# Patient Record
Sex: Female | Born: 1955 | Race: Black or African American | Hispanic: No | Marital: Married | Smoking: Former smoker
Health system: Southern US, Community
[De-identification: ages and names within clinical notes are randomized; demographics above are authoritative.]

## PROBLEM LIST (undated history)

## (undated) DIAGNOSIS — E669 Obesity, unspecified: Secondary | ICD-10-CM

## (undated) DIAGNOSIS — R079 Chest pain, unspecified: Secondary | ICD-10-CM

## (undated) DIAGNOSIS — I890 Lymphedema, not elsewhere classified: Secondary | ICD-10-CM

## (undated) DIAGNOSIS — M1990 Unspecified osteoarthritis, unspecified site: Secondary | ICD-10-CM

## (undated) DIAGNOSIS — M25569 Pain in unspecified knee: Secondary | ICD-10-CM

## (undated) DIAGNOSIS — E559 Vitamin D deficiency, unspecified: Secondary | ICD-10-CM

## (undated) DIAGNOSIS — R7303 Prediabetes: Secondary | ICD-10-CM

## (undated) DIAGNOSIS — J45909 Unspecified asthma, uncomplicated: Secondary | ICD-10-CM

## (undated) DIAGNOSIS — K219 Gastro-esophageal reflux disease without esophagitis: Secondary | ICD-10-CM

## (undated) DIAGNOSIS — R002 Palpitations: Secondary | ICD-10-CM

## (undated) HISTORY — DX: Prediabetes: R73.03

## (undated) HISTORY — DX: Lymphedema, not elsewhere classified: I89.0

## (undated) HISTORY — DX: Vitamin D deficiency, unspecified: E55.9

## (undated) HISTORY — DX: Unspecified osteoarthritis, unspecified site: M19.90

---

## 2007-07-23 ENCOUNTER — Encounter: Payer: Self-pay | Admitting: Internal Medicine

## 2007-11-08 ENCOUNTER — Encounter: Payer: Self-pay | Admitting: Internal Medicine

## 2008-06-02 ENCOUNTER — Ambulatory Visit: Payer: Self-pay | Admitting: Internal Medicine

## 2008-06-02 DIAGNOSIS — J45909 Unspecified asthma, uncomplicated: Secondary | ICD-10-CM | POA: Insufficient documentation

## 2008-06-02 DIAGNOSIS — M545 Low back pain: Secondary | ICD-10-CM | POA: Insufficient documentation

## 2008-06-02 DIAGNOSIS — Z860100 Personal history of colon polyps, unspecified: Secondary | ICD-10-CM | POA: Insufficient documentation

## 2008-06-02 DIAGNOSIS — J309 Allergic rhinitis, unspecified: Secondary | ICD-10-CM | POA: Insufficient documentation

## 2008-06-02 DIAGNOSIS — J4520 Mild intermittent asthma, uncomplicated: Secondary | ICD-10-CM | POA: Insufficient documentation

## 2008-06-02 LAB — CONVERTED CEMR LAB
Albumin: 4.1 g/dL (ref 3.5–5.2)
Alkaline Phosphatase: 78 units/L (ref 39–117)
Basophils Relative: 0 % (ref 0–1)
Bilirubin, Direct: 0.1 mg/dL (ref 0.0–0.3)
Eosinophils Absolute: 0.2 10*3/uL (ref 0.0–0.7)
Glucose, Bld: 86 mg/dL (ref 70–99)
HCT: 38.9 % (ref 36.0–46.0)
Hemoglobin: 12.6 g/dL (ref 12.0–15.0)
Indirect Bilirubin: 0.1 mg/dL (ref 0.0–0.9)
Ketones, urine, test strip: NEGATIVE
MCV: 77.5 fL — ABNORMAL LOW (ref 78.0–100.0)
Monocytes Absolute: 0.6 10*3/uL (ref 0.1–1.0)
Monocytes Relative: 7 % (ref 3–12)
Neutro Abs: 4.3 10*3/uL (ref 1.7–7.7)
Neutrophils Relative %: 53 % (ref 43–77)
Potassium: 4.1 meq/L (ref 3.5–5.3)
RBC: 5.02 M/uL (ref 3.87–5.11)
RDW: 15.2 % (ref 11.5–15.5)
Sodium: 142 meq/L (ref 135–145)
Urobilinogen, UA: 0.2
WBC Urine, dipstick: NEGATIVE
pH: 6.5

## 2008-06-06 ENCOUNTER — Telehealth: Payer: Self-pay | Admitting: Internal Medicine

## 2008-06-14 ENCOUNTER — Telehealth: Payer: Self-pay | Admitting: Internal Medicine

## 2008-12-04 ENCOUNTER — Encounter: Admission: RE | Admit: 2008-12-04 | Discharge: 2008-12-04 | Payer: Self-pay | Admitting: Family Medicine

## 2010-06-24 ENCOUNTER — Other Ambulatory Visit: Payer: Self-pay | Admitting: Internal Medicine

## 2010-09-09 DIAGNOSIS — E559 Vitamin D deficiency, unspecified: Secondary | ICD-10-CM | POA: Insufficient documentation

## 2011-02-11 HISTORY — PX: COLONOSCOPY WITH ESOPHAGOGASTRODUODENOSCOPY (EGD): SHX5779

## 2011-04-28 DIAGNOSIS — IMO0002 Reserved for concepts with insufficient information to code with codable children: Secondary | ICD-10-CM | POA: Insufficient documentation

## 2012-06-03 ENCOUNTER — Emergency Department (HOSPITAL_COMMUNITY): Payer: No Typology Code available for payment source

## 2012-06-03 ENCOUNTER — Emergency Department (HOSPITAL_COMMUNITY)
Admission: EM | Admit: 2012-06-03 | Discharge: 2012-06-04 | Disposition: A | Discharge status: Home / Self Care | Payer: No Typology Code available for payment source | Attending: Emergency Medicine | Admitting: Emergency Medicine

## 2012-06-03 ENCOUNTER — Encounter (HOSPITAL_COMMUNITY): Payer: Self-pay | Admitting: *Deleted

## 2012-06-03 DIAGNOSIS — K5900 Constipation, unspecified: Secondary | ICD-10-CM | POA: Insufficient documentation

## 2012-06-03 DIAGNOSIS — R1032 Left lower quadrant pain: Secondary | ICD-10-CM | POA: Insufficient documentation

## 2012-06-03 DIAGNOSIS — R109 Unspecified abdominal pain: Secondary | ICD-10-CM

## 2012-06-03 DIAGNOSIS — N390 Urinary tract infection, site not specified: Secondary | ICD-10-CM | POA: Insufficient documentation

## 2012-06-03 DIAGNOSIS — K644 Residual hemorrhoidal skin tags: Secondary | ICD-10-CM | POA: Insufficient documentation

## 2012-06-03 DIAGNOSIS — M25559 Pain in unspecified hip: Secondary | ICD-10-CM | POA: Insufficient documentation

## 2012-06-03 DIAGNOSIS — R110 Nausea: Secondary | ICD-10-CM | POA: Insufficient documentation

## 2012-06-03 LAB — COMPREHENSIVE METABOLIC PANEL
ALT: 14 U/L (ref 0–35)
AST: 18 U/L (ref 0–37)
Albumin: 3.8 g/dL (ref 3.5–5.2)
Alkaline Phosphatase: 69 U/L (ref 39–117)
CO2: 29 mEq/L (ref 19–32)
Creatinine, Ser: 1.07 mg/dL (ref 0.50–1.10)
GFR calc Af Amer: 65 mL/min — ABNORMAL LOW (ref >90)
GFR calc non Af Amer: 56 mL/min — ABNORMAL LOW (ref >90)
Glucose, Bld: 84 mg/dL (ref 70–99)
Potassium: 3.9 mEq/L (ref 3.5–5.1)
Total Bilirubin: 0.2 mg/dL — ABNORMAL LOW (ref 0.3–1.2)

## 2012-06-03 LAB — URINALYSIS, ROUTINE W REFLEX MICROSCOPIC
Bilirubin Urine: NEGATIVE
Glucose, UA: NEGATIVE mg/dL
Hgb urine dipstick: NEGATIVE
Ketones, ur: NEGATIVE mg/dL
Leukocytes, UA: NEGATIVE
Protein, ur: NEGATIVE mg/dL
Urobilinogen, UA: 0.2 mg/dL (ref 0.0–1.0)

## 2012-06-03 LAB — CBC WITH DIFFERENTIAL/PLATELET
Eosinophils Relative: 2 % (ref 0–5)
MCV: 78.1 fL (ref 78.0–100.0)
Platelets: 245 10*3/uL (ref 150–400)
RBC: 5.16 MIL/uL — ABNORMAL HIGH (ref 3.87–5.11)
RDW: 14.8 % (ref 11.5–15.5)

## 2012-06-03 MED ORDER — IOHEXOL 300 MG/ML  SOLN
50.0000 mL | Freq: Once | INTRAMUSCULAR | Status: AC | PRN
  Administered 2012-06-03: 50 mL via ORAL

## 2012-06-03 MED ORDER — IOHEXOL 300 MG/ML  SOLN: 100.0000 mL | Freq: Once | INTRAMUSCULAR | Status: AC | PRN

## 2012-06-03 NOTE — ED Notes (Signed)
Pt given oral contrast by CT.

## 2012-06-03 NOTE — ED Notes (Signed)
The pt has had lt flank and abd pain for 2 weeks.  She saw her doctor on Saturday and was diagnosed with constipation .  bm each day.  She is also c/o nausea

## 2012-06-03 NOTE — ED Notes (Signed)
Pt c/o pain in lower right back radiating into right groin area.  St's walking or standing causes pain to increase.  Pt also c/o constipation st's she took a laxative with some results.

## 2012-06-03 NOTE — ED Notes (Signed)
Pt finished 1st cup of contrast.  CT made aware

## 2012-06-03 NOTE — ED Provider Notes (Signed)
History     CSN: [id number]  Arrival date & time 06/03/12  2003   First MD Initiated Contact with Patient 06/03/12 2101      Chief Complaint  Patient presents with  . Abdominal Pain    (Consider location/radiation/quality/duration/timing/severity/associated sxs/prior treatment) HPI Comments: Patient with past history of C-section presents with complaint of left hip, left lower quadrant abdominal pelvic pain for the past 2 weeks. Pain radiates into her left thigh. It is worse with movement, palpation, and walking. It is described as sharp. It is constant. Patient saw her primary care physician several days ago and was diagnosed with constipation and started on laxatives. She was also treated for urinary tract infection. Patient is been having several small bowel movements a day. She's had nausea but no vomiting. No fever, weight loss, urinary symptoms. She's been taking ibuprofen for pain without much relief. Patient states that the pain is gradually worsening with time. She denies claudication symptoms. No history of A. fib, peripheral vascular disease. No skin changes. Onset acute. Course is gradually worsening. Nothing makes symptoms better.  The history is provided by the patient.    History reviewed. No pertinent past medical history.  History reviewed. No pertinent past surgical history.  No family history on file.  History  Substance Use Topics  . Smoking status: Never Smoker   . Smokeless tobacco: Not on file  . Alcohol Use: No    OB History   Grav Para Term Preterm Abortions TAB SAB Ect Mult Living                  Review of Systems  Constitutional: Negative for fever.  HENT: Negative for sore throat and rhinorrhea.   Eyes: Negative for redness.  Respiratory: Negative for cough.   Cardiovascular: Negative for chest pain.  Gastrointestinal: Positive for nausea and abdominal pain. Negative for vomiting, diarrhea, constipation and blood in stool.  Genitourinary:  Negative for dysuria.  Musculoskeletal: Negative for myalgias.  Skin: Negative for rash.  Neurological: Negative for headaches.    Allergies  Review of patient's allergies indicates no known allergies.  Home Medications   Current Outpatient Rx  Name  Route  Sig  Dispense  Refill  . ibuprofen (ADVIL,MOTRIN) 200 MG tablet   Oral   Take 200 mg by mouth every 6 (six) hours as needed for pain.         . Vitamin D, Ergocalciferol, (DRISDOL) 50000 UNITS CAPS   Oral   Take 50,000 Units by mouth every 7 (seven) days. Take on saturdays           BP 159/78  Pulse 80  Temp(Src) 99.1 F (37.3 C) (Oral)  Resp 18  SpO2 99%  Physical Exam  Nursing note and vitals reviewed. Constitutional: She appears well-developed and well-nourished.  HENT:  Head: Normocephalic and atraumatic.  Eyes: Conjunctivae are normal. Right eye exhibits no discharge. Left eye exhibits no discharge.  Neck: Normal range of motion. Neck supple.  Cardiovascular: Normal rate, regular rhythm and normal heart sounds.   Pulses:      Dorsalis pedis pulses are 2+ on the right side, and 2+ on the left side.       Posterior tibial pulses are 2+ on the right side, and 2+ on the left side.  Pulmonary/Chest: Effort normal and breath sounds normal.  Abdominal: Soft. Bowel sounds are normal. She exhibits no distension. There is tenderness in the suprapubic area and left lower quadrant. There is no rigidity, no  rebound, no guarding, no CVA tenderness, no tenderness at McBurney's point and negative Murphy's sign.    Genitourinary: Rectal exam shows external hemorrhoid. Rectal exam shows no fissure, no mass, no tenderness and anal tone normal. Guaiac negative stool.  No fecal impaction noted.  Musculoskeletal:  Patient with normal range of motion of left hip actively without pain  Neurological: She is alert.  Skin: Skin is warm and dry.  Psychiatric: She has a normal mood and affect.    ED Course  Procedures (including  critical care time)  Labs Reviewed  CBC WITH DIFFERENTIAL - Abnormal; Notable for the following:    WBC 11.0 (*)    RBC 5.16 (*)    All other components within normal limits  COMPREHENSIVE METABOLIC PANEL - Abnormal; Notable for the following:    Total Bilirubin 0.2 (*)    GFR calc non Af Amer 56 (*)    GFR calc Af Amer 65 (*)    All other components within normal limits  URINALYSIS, ROUTINE W REFLEX MICROSCOPIC  OCCULT BLOOD, POC DEVICE   Ct Abdomen Pelvis W Contrast  06/04/2012  *RADIOLOGY REPORT*  Clinical Data: Left lower quadrant pelvic pain.  CT ABDOMEN AND PELVIS WITH CONTRAST  Technique:  Multidetector CT imaging of the abdomen and pelvis was performed following the standard protocol during bolus administration of intravenous contrast.  Contrast: OMNIPAQUE IOHEXOL 300 MG/ML  SOLN  Comparison: None  Findings: Lung bases:  Normal  Abdomen/pelvis:  Possible mild hepatic steatosis. Prominence of the right lobe of the liver, with a shape suggestive of a Reidel's lobe.  Normal spleen, stomach, pancreas, gallbladder, biliary tract, adrenal glands, kidneys.  Multiple phleboliths in the pelvis, without hydroureter.  No retroperitoneal or retrocrural adenopathy.  Fluid filled colon, suggesting a diarrheal state. Normal terminal ileum.  The appendix may be diminutive on image 50/series 3.  No right lower quadrant inflammation is seen.  Normal small bowel without abdominal ascites.    No pelvic adenopathy.    Normal urinary bladder and uterus.  No adnexal mass.  No significant free fluid.     Bones/Musculoskeletal:   lumbar spondylosis.  .  IMPRESSION: 1. No acute process or explanation for left lower quadrant pain.  2.  Fluid filled colon, suggesting a diarrheal state.   Original Report Authenticated By: Jeronimo Greaves, M.D.      1. Abdominal pain     10:08 PM Patient seen and examined. Work-up initiated. Medications ordered. Rectal exam performed with nurse chaperone Selena Batten). Discussed labs and  exam. Discussed CT vs f/u with PCP. Patient would like to have CT performed.   Vital signs reviewed and are as follows: Filed Vitals:   06/03/12 2015  BP: 159/78  Pulse: 80  Temp: 99.1 F (37.3 C)  Resp: 18   CT was reviewed by myself. PT informed of results. Re-exam unchanged. Tenderness but no rebound or guarding. Pt requests tylenol for pain. No vomiting.   Discussed need to follow-up with PCP for further evaluation.   The patient was urged to return to the Emergency Department immediately with worsening of current symptoms, worsening abdominal pain, persistent vomiting, blood noted in stools, fever, or any other concerns. The patient verbalized understanding.   BP 117/65  Pulse 74  Temp(Src) 99.1 F (37.3 C) (Oral)  Resp 18  SpO2 96%   MDM  Patient with LLQ abd/pelvic pain. There is also component of thigh pain -- however this seems to be referred. Patient can range hip without difficulty.  She has strong arterial pulses in feet and no claudication symptoms. Also considered radicular pain.  Vitals are stable, no fever.  No signs of dehydration, tolerating PO's.  Lungs are clear.  CT does not show specific cause to explain pain. No concern for appendicitis, cholecystitis, pancreatitis, ruptured viscus, UTI, kidney stone, or any other abdominal etiology.  Supportive therapy indicated with return if symptoms worsen.  Patient counseled.         Renne Crigler, PA-C 06/05/12 1314

## 2012-06-04 MED ORDER — IBUPROFEN 800 MG PO TABS
800.0000 mg | ORAL_TABLET | Freq: Once | ORAL | Status: DC
  Filled 2012-06-04: qty 1

## 2012-06-04 MED ORDER — IOHEXOL 300 MG/ML  SOLN
100.0000 mL | Freq: Once | INTRAMUSCULAR | Status: AC | PRN
  Administered 2012-06-04: 100 mL via INTRAVENOUS

## 2012-06-04 MED ORDER — ACETAMINOPHEN 325 MG PO TABS
650.0000 mg | ORAL_TABLET | Freq: Once | ORAL | Status: AC
  Administered 2012-06-04: 650 mg via ORAL
  Filled 2012-06-04: qty 2

## 2012-06-04 MED ORDER — OXYCODONE-ACETAMINOPHEN 5-325 MG PO TABS
2.0000 | ORAL_TABLET | Freq: Once | ORAL | Status: DC
  Filled 2012-06-04: qty 2

## 2012-06-04 NOTE — ED Notes (Signed)
ED PA Josh in with pt

## 2012-06-04 NOTE — ED Notes (Signed)
Pt st's she is now in too much pain, requesting pain medication.  PA Josh notified.

## 2012-06-07 NOTE — ED Provider Notes (Signed)
Medical screening examination/treatment/procedure(s) were performed by non-physician practitioner and as supervising physician I was immediately available for consultation/collaboration.   Dione Booze, MD 06/07/12 [phone]

## 2012-12-07 ENCOUNTER — Ambulatory Visit: Payer: No Typology Code available for payment source | Admitting: Internal Medicine

## 2012-12-23 ENCOUNTER — Emergency Department (HOSPITAL_COMMUNITY): Payer: No Typology Code available for payment source

## 2012-12-23 ENCOUNTER — Observation Stay (HOSPITAL_COMMUNITY)
Admission: EM | Admit: 2012-12-23 | Discharge: 2012-12-25 | Disposition: A | Discharge status: Home / Self Care | Payer: No Typology Code available for payment source | Attending: Internal Medicine | Admitting: Internal Medicine

## 2012-12-23 DIAGNOSIS — E669 Obesity, unspecified: Secondary | ICD-10-CM | POA: Insufficient documentation

## 2012-12-23 DIAGNOSIS — R0789 Other chest pain: Principal | ICD-10-CM | POA: Diagnosis present

## 2012-12-23 DIAGNOSIS — Z8249 Family history of ischemic heart disease and other diseases of the circulatory system: Secondary | ICD-10-CM | POA: Insufficient documentation

## 2012-12-23 DIAGNOSIS — M545 Low back pain, unspecified: Secondary | ICD-10-CM | POA: Insufficient documentation

## 2012-12-23 DIAGNOSIS — M25569 Pain in unspecified knee: Secondary | ICD-10-CM | POA: Insufficient documentation

## 2012-12-23 DIAGNOSIS — F17200 Nicotine dependence, unspecified, uncomplicated: Secondary | ICD-10-CM | POA: Insufficient documentation

## 2012-12-23 DIAGNOSIS — R0602 Shortness of breath: Secondary | ICD-10-CM | POA: Insufficient documentation

## 2012-12-23 DIAGNOSIS — F419 Anxiety disorder, unspecified: Secondary | ICD-10-CM | POA: Diagnosis present

## 2012-12-23 DIAGNOSIS — F411 Generalized anxiety disorder: Secondary | ICD-10-CM | POA: Insufficient documentation

## 2012-12-23 DIAGNOSIS — E663 Overweight: Secondary | ICD-10-CM | POA: Insufficient documentation

## 2012-12-23 DIAGNOSIS — R079 Chest pain, unspecified: Secondary | ICD-10-CM

## 2012-12-23 HISTORY — DX: Palpitations: R00.2

## 2012-12-23 HISTORY — DX: Pain in unspecified knee: M25.569

## 2012-12-23 HISTORY — DX: Chest pain, unspecified: R07.9

## 2012-12-23 HISTORY — DX: Obesity, unspecified: E66.9

## 2012-12-23 LAB — BASIC METABOLIC PANEL
BUN: 14 mg/dL (ref 6–23)
CO2: 27 mEq/L (ref 19–32)
Calcium: 8.9 mg/dL (ref 8.4–10.5)
Chloride: 104 mEq/L (ref 96–112)
Creatinine, Ser: 0.92 mg/dL (ref 0.50–1.10)
GFR calc Af Amer: 77 mL/min — ABNORMAL LOW (ref >90)
GFR calc non Af Amer: 67 mL/min — ABNORMAL LOW (ref >90)
Potassium: 4 mEq/L (ref 3.5–5.1)

## 2012-12-23 LAB — POCT I-STAT TROPONIN I: Troponin i, poc: 0 ng/mL (ref 0.00–0.08)

## 2012-12-23 LAB — CBC
HCT: 37.8 % (ref 36.0–46.0)
Hemoglobin: 12.6 g/dL (ref 12.0–15.0)
MCH: 26.4 pg (ref 26.0–34.0)
MCV: 79.2 fL (ref 78.0–100.0)
RBC: 4.77 MIL/uL (ref 3.87–5.11)
RDW: 14.6 % (ref 11.5–15.5)
WBC: 10.7 10*3/uL — ABNORMAL HIGH (ref 4.0–10.5)

## 2012-12-23 MED ORDER — ACETAMINOPHEN 325 MG PO TABS: 650.0000 mg | ORAL_TABLET | Freq: Once | ORAL | Status: DC

## 2012-12-23 MED ORDER — NITROGLYCERIN 0.4 MG SL SUBL
0.4000 mg | SUBLINGUAL_TABLET | SUBLINGUAL | Status: DC | PRN
  Administered 2012-12-23: 0.4 mg via SUBLINGUAL
  Filled 2012-12-23: qty 25

## 2012-12-23 MED ORDER — ASPIRIN 81 MG PO CHEW
324.0000 mg | CHEWABLE_TABLET | Freq: Once | ORAL | Status: AC
  Administered 2012-12-23: 324 mg via ORAL
  Filled 2012-12-23: qty 4

## 2012-12-23 NOTE — ED Provider Notes (Signed)
CSN: [id number]     Arrival date & time 12/23/12  2052 History   First MD Initiated Contact with Patient 12/23/12 2055     Chief Complaint  Patient presents with  . Chest Pain  . Shortness of Breath   (Consider location/radiation/quality/duration/timing/severity/associated sxs/prior Treatment) HPI Renee Kerr is a 57 y.o. female who presents emergency department complaining of chest pain. Patient states that she was at church when suddenly she began feeling chest tightness and shortness of breath. States symptoms improved once she got to emergency department. States she still having some tightness. She denies any pain. She states that the shortness of breath improved as well. She denies any history of the same. She states that 4 days ago she had an episode of chest palpitations that lasted approximately 20 minutes but resolved. She denies any feeling of heart racing or palpitations today. Patient denies any lower leg swelling. She denies any recent travel or surgeries. She did not take any medications prior to coming in. She denies any prior cardiac or medical problems. She does not take any medications. Patient does have history of cardiac disease in both parents.  No past medical history on file. No past surgical history on file. No family history on file. History  Substance Use Topics  . Smoking status: Never Smoker   . Smokeless tobacco: Not on file  . Alcohol Use: No   OB History   Grav Para Term Preterm Abortions TAB SAB Ect Mult Living                 Review of Systems  Constitutional: Negative for fever and chills.  Respiratory: Positive for chest tightness and shortness of breath. Negative for cough and wheezing.   Cardiovascular: Positive for chest pain. Negative for palpitations and leg swelling.  Gastrointestinal: Negative for nausea, vomiting, abdominal pain and diarrhea.  Genitourinary: Negative for dysuria, flank pain, vaginal bleeding, vaginal discharge, vaginal  pain and pelvic pain.  Musculoskeletal: Negative for arthralgias, myalgias, neck pain and neck stiffness.  Skin: Negative for rash.  Neurological: Negative for dizziness, weakness and headaches.  All other systems reviewed and are negative.    Allergies  Review of patient's allergies indicates no known allergies.  Home Medications   Current Outpatient Rx  Name  Route  Sig  Dispense  Refill  . ibuprofen (ADVIL,MOTRIN) 200 MG tablet   Oral   Take 200 mg by mouth every 6 (six) hours as needed for pain.         . Vitamin D, Ergocalciferol, (DRISDOL) 50000 UNITS CAPS   Oral   Take 50,000 Units by mouth every 7 (seven) days. Take on saturdays          BP 148/74  Pulse 94  Temp(Src) 98.7 F (37.1 C) (Oral)  Resp 17  Ht 4\' 11"  (1.499 m)  Wt 204 lb (92.534 kg)  BMI 41.18 kg/m2  SpO2 97% Physical Exam  Nursing note and vitals reviewed. Constitutional: She is oriented to person, place, and time. She appears well-developed and well-nourished. No distress.  HENT:  Head: Normocephalic.  Eyes: Conjunctivae are normal.  Neck: Neck supple.  Cardiovascular: Normal rate, regular rhythm and normal heart sounds.   Pulmonary/Chest: Effort normal and breath sounds normal. No respiratory distress. She has no wheezes. She has no rales.  Abdominal: Soft. Bowel sounds are normal. She exhibits no distension. There is no tenderness. There is no rebound.  Musculoskeletal: She exhibits no edema.  Neurological: She is alert and oriented to  person, place, and time.  Skin: Skin is warm and dry.  Psychiatric: She has a normal mood and affect. Her behavior is normal.    ED Course  Procedures (including critical care time) Labs Review Labs Reviewed  CBC - Abnormal; Notable for the following:    WBC 10.7 (*)    All other components within normal limits  BASIC METABOLIC PANEL - Abnormal; Notable for the following:    Glucose, Bld 101 (*)    GFR calc non Af Amer 67 (*)    GFR calc Af Amer 77  (*)    All other components within normal limits  POCT I-STAT TROPONIN I   Imaging Review Dg Chest Port 1 View  12/23/2012   CLINICAL DATA:  Chest pain and shortness of Breath  EXAM: PORTABLE CHEST - 1 VIEW  COMPARISON:  None.  FINDINGS: The heart size and mediastinal contours are within normal limits. Both lungs are clear. The bony thorax is demineralized but grossly intact.  IMPRESSION: No active disease.   Electronically Signed   By: Amie Portland M.D.   On: 12/23/2012 21:23    EKG Interpretation   None       MDM   1. Chest pain   2. Anxiety   3. Lumbago     Patient here with new onset of chest pain. She is otherwise healthy. He does have family history of cardiac disease. Her pain did improve after one nitroglycerin and aspirin. Her initial troponin and EKG unremarkable. Chest x-ray is negative. Patient will need admission for rule out. VS unremarkable except for slightly elevated BP.    Discussed with [facility] will admit.  Filed Vitals:   12/23/12 2058 12/23/12 2219 12/23/12 2224  BP: 148/74 134/61 137/70  Pulse: 94 74 78  Temp: 98.7 F (37.1 C)    TempSrc: Oral    Resp: 17 18 15   Height: 4\' 11"  (1.499 m)    Weight: 204 lb (92.534 kg)    SpO2: 97% 98% 93%       Lottie Mussel, PA-C 12/23/12 2316

## 2012-12-23 NOTE — ED Notes (Signed)
Pt reports she has been feeling "flurries" in her chest for the past 3 days, reports centralized chest tightness with ShOB began at 2040.  Pt denies hx of any of these feelings before.

## 2012-12-23 NOTE — ED Provider Notes (Signed)
Medical screening examination/treatment/procedure(s) were conducted as a shared visit with non-physician practitioner(s) and myself.  I personally evaluated the patient during the encounter.  EKG Interpretation   None       Date: 12/23/2012  Rate: 86  Rhythm: normal sinus rhythm  QRS Axis: normal  Intervals: normal  ST/T Wave abnormalities: normal  Conduction Disutrbances:none  Narrative Interpretation: multiple PVCs  Old EKG Reviewed: none available    Patient here for chest pain. L sided chest tightness, non-radiating, no prior hx of same. AFVSS here. No SOB, nausea, vomiting. No alleviating/exacerbating. Exam benign. Admitted for ACS r/o.  Dagmar Hait, MD 12/23/12 [phone]

## 2012-12-23 NOTE — ED Provider Notes (Signed)
Date: 12/23/2012  Rate: 86  Rhythm: normal sinus rhythm  QRS Axis: normal  Intervals: normal  ST/T Wave abnormalities: normal  Conduction Disutrbances:none  Narrative Interpretation:   Old EKG Reviewed: none available    Dagmar Hait, MD 12/23/12 2118

## 2012-12-24 ENCOUNTER — Encounter (HOSPITAL_COMMUNITY): Payer: Self-pay | Admitting: Physician Assistant

## 2012-12-24 DIAGNOSIS — I517 Cardiomegaly: Secondary | ICD-10-CM

## 2012-12-24 DIAGNOSIS — R0789 Other chest pain: Secondary | ICD-10-CM

## 2012-12-24 LAB — HEMOGLOBIN A1C
Hgb A1c MFr Bld: 6.1 % — ABNORMAL HIGH (ref <5.7)
Mean Plasma Glucose: 128 mg/dL — ABNORMAL HIGH (ref <117)

## 2012-12-24 LAB — TROPONIN I: Troponin I: <0.3 ng/mL (ref <0.30)

## 2012-12-24 LAB — LIPID PANEL
Cholesterol: 97 mg/dL (ref 0–200)
LDL Cholesterol: 31 mg/dL (ref 0–99)
Triglycerides: 136 mg/dL (ref <150)

## 2012-12-24 LAB — TSH: TSH: 1.953 u[IU]/mL (ref 0.350–4.500)

## 2012-12-24 MED ORDER — PANTOPRAZOLE SODIUM 40 MG PO TBEC: 40.0000 mg | DELAYED_RELEASE_TABLET | Freq: Every day | ORAL | Status: DC

## 2012-12-24 MED ORDER — SODIUM CHLORIDE 0.9 % IJ SOLN
3.0000 mL | Freq: Two times a day (BID) | INTRAMUSCULAR | Status: DC
  Administered 2012-12-24: 3 mL via INTRAVENOUS

## 2012-12-24 MED ORDER — SODIUM CHLORIDE 0.9 % IJ SOLN: 3.0000 mL | INTRAMUSCULAR | Status: DC | PRN

## 2012-12-24 MED ORDER — ENOXAPARIN SODIUM 40 MG/0.4ML ~~LOC~~ SOLN
40.0000 mg | SUBCUTANEOUS | Status: DC
  Administered 2012-12-24: 40 mg via SUBCUTANEOUS
  Filled 2012-12-24 (×2): qty 0.4

## 2012-12-24 MED ORDER — PANTOPRAZOLE SODIUM 40 MG PO TBEC
40.0000 mg | DELAYED_RELEASE_TABLET | Freq: Every day | ORAL | Status: DC
  Administered 2012-12-24 – 2012-12-25 (×2): 40 mg via ORAL
  Filled 2012-12-24 (×3): qty 1

## 2012-12-24 MED ORDER — ONDANSETRON HCL 4 MG/2ML IJ SOLN: 4.0000 mg | Freq: Four times a day (QID) | INTRAMUSCULAR | Status: DC | PRN

## 2012-12-24 MED ORDER — SODIUM CHLORIDE 0.9 % IJ SOLN
3.0000 mL | Freq: Two times a day (BID) | INTRAMUSCULAR | Status: DC
  Administered 2012-12-24 – 2012-12-25 (×3): 3 mL via INTRAVENOUS

## 2012-12-24 MED ORDER — SODIUM CHLORIDE 0.9 % IV SOLN: 250.0000 mL | INTRAVENOUS | Status: DC | PRN

## 2012-12-24 MED ORDER — DOCUSATE SODIUM 100 MG PO CAPS
100.0000 mg | ORAL_CAPSULE | Freq: Two times a day (BID) | ORAL | Status: DC
  Administered 2012-12-24 – 2012-12-25 (×3): 100 mg via ORAL
  Filled 2012-12-24 (×5): qty 1

## 2012-12-24 MED ORDER — ASPIRIN EC 325 MG PO TBEC
325.0000 mg | DELAYED_RELEASE_TABLET | Freq: Every day | ORAL | Status: DC
  Administered 2012-12-24 – 2012-12-25 (×2): 325 mg via ORAL
  Filled 2012-12-24 (×2): qty 1

## 2012-12-24 MED ORDER — MAGNESIUM CITRATE PO SOLN
1.0000 | Freq: Once | ORAL | Status: AC
  Administered 2012-12-24: 1 via ORAL

## 2012-12-24 MED ORDER — ONDANSETRON HCL 4 MG PO TABS: 4.0000 mg | ORAL_TABLET | Freq: Four times a day (QID) | ORAL | Status: DC | PRN

## 2012-12-24 MED ORDER — MORPHINE SULFATE 2 MG/ML IJ SOLN: 2.0000 mg | INTRAMUSCULAR | Status: DC | PRN

## 2012-12-24 NOTE — H&P (Signed)
[facility] History and Physical  Lilliane Sposito UVO:[id number] DOB: January 09, 1956    PCP:   [name], MD   Chief Complaint: intermittent but transient chest pressure.  HPI: Renee Kerr is an 57 y.o. female with hx of bilateral knee pain, on Ibuprofen, but otherwise healthy, on no other chronic medication, presents to the ER with substernal chest pressure.  She was coming back from church and experienced substernal chest pain/pressure.  There was slight shortness of breath, but no nausea, vomiting or diaphoresis.  Pain has been non pleuritic. Her pain resolved spontaneously upon arrival in the ER lasted about 20 minutes in total time.   In the ER, she had another transient episode, and was given NTG with improvement of her symptoms.  Her EKG was unremarkable, and her troponin was negative.  Her chest Xray was clear.  She said both of her parents had MI in their 6's. Hospitalist was asked to admit her for chest pain.  Rewiew of Systems:  Constitutional: Negative for malaise, fever and chills. No significant weight loss or weight gain Eyes: Negative for eye pain, redness and discharge, diplopia, visual changes, or flashes of light. ENMT: Negative for ear pain, hoarseness, nasal congestion, sinus pressure and sore throat. No headaches; tinnitus, drooling, or problem swallowing. Cardiovascular: Negative for palpitations, diaphoresis, dyspnea and peripheral edema. ; No orthopnea, PND Respiratory: Negative for cough, hemoptysis, wheezing and stridor. No pleuritic chestpain. Gastrointestinal: Negative for nausea, vomiting, diarrhea, constipation, abdominal pain, melena, blood in stool, hematemesis, jaundice and rectal bleeding.    Genitourinary: Negative for frequency, dysuria, incontinence,flank pain and hematuria; Musculoskeletal: Negative for back pain and neck pain. Negative for swelling and trauma.;  Skin: . Negative for pruritus, rash, abrasions, bruising and skin  lesion.; ulcerations Neuro: Negative for headache, lightheadedness and neck stiffness. Negative for weakness, altered level of consciousness , altered mental status, extremity weakness, burning feet, involuntary movement, seizure and syncope.  Psych: negative for  insomnia, tearfulness, panic attacks, hallucinations, paranoia, suicidal or homicidal ideation    No past medical history on file.  No past surgical history on file.  Medications:  HOME MEDS: Prior to Admission medications   Medication Sig Start Date End Date Taking? Authorizing Provider  ibuprofen (ADVIL,MOTRIN) 200 MG tablet Take 400 mg by mouth every 6 (six) hours as needed (pain).    Yes Historical Provider, MD  Vitamin D, Ergocalciferol, (DRISDOL) 50000 UNITS CAPS Take 50,000 Units by mouth every 7 (seven) days. Take on saturdays   Yes Historical Provider, MD     Allergies:  No Known Allergies  Social History:   reports that she has never smoked. She does not have any smokeless tobacco history on file. She reports that she does not drink alcohol. Her drug history is not on file.  Family History: No family history on file.   Physical Exam: Filed Vitals:   12/23/12 2058 12/23/12 2219 12/23/12 2224 12/24/12 0007  BP: 148/74 134/61 137/70 126/64  Pulse: 94 74 78 66  Temp: 98.7 F (37.1 C)   99 F (37.2 C)  TempSrc: Oral   Oral  Resp: 17 18 15 18   Height: 4\' 11"  (1.499 m)   4\' 11"  (1.499 m)  Weight: 92.534 kg (204 lb)   91.8 kg (202 lb 6.1 oz)  SpO2: 97% 98% 93% 99%   Blood pressure 126/64, pulse 66, temperature 99 F (37.2 C), temperature source Oral, resp. rate 18, height 4\' 11"  (1.499 m), weight 91.8 kg (202 lb 6.1 oz), SpO2 99.00%.  GEN:  Pleasant  patient lying in the stretcher in no acute distress; cooperative with exam. She appears anxious. PSYCH:  alert and oriented x4; does not appear anxious or depressed; affect is appropriate. HEENT: Mucous membranes pink and anicteric; PERRLA; EOM intact; no  cervical lymphadenopathy nor thyromegaly or carotid bruit; no JVD; There were no stridor. Neck is very supple. Breasts:: Not examined CHEST WALL: No tenderness CHEST: Normal respiration, clear to auscultation bilaterally.  HEART: Regular rate and rhythm.  There are no murmur, rub, or gallops.   BACK: No kyphosis or scoliosis; no CVA tenderness ABDOMEN: soft and non-tender; no masses, no organomegaly, normal abdominal bowel sounds; no pannus; no intertriginous candida. There is no rebound and no distention. Rectal Exam: Not done EXTREMITIES: No bone or joint deformity; age-appropriate arthropathy of the hands and knees; no edema; no ulcerations.  There is no calf tenderness. Genitalia: not examined PULSES: 2+ and symmetric SKIN: Normal hydration no rash or ulceration CNS: Cranial nerves 2-12 grossly intact no focal lateralizing neurologic deficit.  Speech is fluent; uvula elevated with phonation, facial symmetry and tongue midline. DTR are normal bilaterally, cerebella exam is intact, barbinski is negative and strengths are equaled bilaterally.  No sensory loss.   Labs on Admission:  Basic Metabolic Panel:  Recent Labs Lab 12/23/12 2110  NA 140  K 4.0  CL 104  CO2 27  GLUCOSE 101*  BUN 14  CREATININE 0.92  CALCIUM 8.9   Liver Function Tests: No results found for this basename: AST, ALT, ALKPHOS, BILITOT, PROT, ALBUMIN,  in the last 168 hours No results found for this basename: LIPASE, AMYLASE,  in the last 168 hours No results found for this basename: AMMONIA,  in the last 168 hours CBC:  Recent Labs Lab 12/23/12 2110  WBC 10.7*  HGB 12.6  HCT 37.8  MCV 79.2  PLT 208   Cardiac Enzymes: No results found for this basename: CKTOTAL, CKMB, CKMBINDEX, TROPONINI,  in the last 168 hours  CBG: No results found for this basename: GLUCAP,  in the last 168 hours   Radiological Exams on Admission: Dg Chest Port 1 View  12/23/2012   CLINICAL DATA:  Chest pain and shortness of  Breath  EXAM: PORTABLE CHEST - 1 VIEW  COMPARISON:  None.  FINDINGS: The heart size and mediastinal contours are within normal limits. Both lungs are clear. The bony thorax is demineralized but grossly intact.  IMPRESSION: No active disease.   Electronically Signed   By: Amie Portland M.D.   On: 12/23/2012 21:23    EKG: Independently reviewed. NSR with no acute ischemic or injurious pattern.   Assessment/Plan Present on Admission:  . Chest pain, atypical . LOW BACK PAIN . Anxiety  PLAN:  WIll admit her for atypical chest pain.  I suspect it is noncardiac, but could be prinzmetal or esosphageal spasm.  She does have several cardiac risk factors, including being overweight, sedentary life style, and family hx of premature CAD.  Will cycle her troponin, and obtain an ECHO.  Risk modification discussed.  I will start her on an ASA/day.  She is stable, full code, and will be admitted to [facility] service.  She would benefit getting a nuclear stress test once r/out.  Thank you for asking me to partake in the care of this nice patient.    Other plans as per orders.  Code Status: FULL Unk Lightning, MD. [facility] Pager [phone] 7pm to 7am.  12/24/2012, 1:22 AM

## 2012-12-24 NOTE — Progress Notes (Signed)
[facility] PROGRESS NOTE  Renee Kerr GNF:[id number] DOB: Sep 19, 1955 DOA: 12/23/2012 PCP: Renee Boga, MD  Assessment/Plan: 1. Atypical chest pain: - admitted to telemetry.  - resolved after arrival to ED.  - serial enzymes are negative.  - echo done and pending - cardiology ocnsulted and stress in am.  - resume protonix .   Code Status: full code Family Communication: husband at bedside Disposition Plan: possibly tomorrow.    Consultants:  cardiology Procedures:  Stress in am  Antibiotics:  none  HPI/Subjective: CHEST pain resolved  Objective: Filed Vitals:   12/24/12 1401  BP: 115/60  Pulse: 66  Temp:   Resp: 18    Intake/Output Summary (Last 24 hours) at 12/24/12 1752 Last data filed at 12/24/12 1401  Gross per 24 hour  Intake      0 ml  Output      0 ml  Net      0 ml   Filed Weights   12/23/12 2058 12/24/12 0007  Weight: 92.534 kg (204 lb) 91.8 kg (202 lb 6.1 oz)    Exam:   General:  ALERT Afebrile comfortable.   Cardiovascular: s1s2  Respiratory: ctab  Abdomen: soft NT ND BS+   Data Reviewed: Basic Metabolic Panel:  Recent Labs Lab 12/23/12 2110  NA 140  K 4.0  CL 104  CO2 27  GLUCOSE 101*  BUN 14  CREATININE 0.92  CALCIUM 8.9   Liver Function Tests: No results found for this basename: AST, ALT, ALKPHOS, BILITOT, PROT, ALBUMIN,  in the last 168 hours No results found for this basename: LIPASE, AMYLASE,  in the last 168 hours No results found for this basename: AMMONIA,  in the last 168 hours CBC:  Recent Labs Lab 12/23/12 2110  WBC 10.7*  HGB 12.6  HCT 37.8  MCV 79.2  PLT 208   Cardiac Enzymes:  Recent Labs Lab 12/24/12 0110 12/24/12 0650 12/24/12 1247  TROPONINI <0.30 <0.30 <0.30   BNP (last 3 results) No results found for this basename: PROBNP,  in the last 8760 hours CBG: No results found for this basename: GLUCAP,  in the last 168 hours  No results found for this or any  previous visit (from the past 240 hour(s)).   Studies: Dg Chest Port 1 View  12/23/2012   CLINICAL DATA:  Chest pain and shortness of Breath  EXAM: PORTABLE CHEST - 1 VIEW  COMPARISON:  None.  FINDINGS: The heart size and mediastinal contours are within normal limits. Both lungs are clear. The bony thorax is demineralized but grossly intact.  IMPRESSION: No active disease.   Electronically Signed   By: Amie Portland M.D.   On: 12/23/2012 21:23    Scheduled Meds: . aspirin EC  325 mg Oral Daily  . docusate sodium  100 mg Oral BID  . enoxaparin (LOVENOX) injection  40 mg Subcutaneous Q24H  . pantoprazole  40 mg Oral Q0600  . sodium chloride  3 mL Intravenous Q12H  . sodium chloride  3 mL Intravenous Q12H   Continuous Infusions:   Principal Problem:   Chest pain, atypical Active Problems:   LOW BACK PAIN   Anxiety    Time spent: 25 MIN    Renee Kerr  [facility] Pager [phone]. If 7PM-7AM, please contact night-coverage at [email or web address], password [facility] 12/24/2012, 5:52 PM  LOS: 1 day

## 2012-12-24 NOTE — Progress Notes (Signed)
Echocardiogram 2D Echocardiogram has been performed.  Dorothey Baseman 12/24/2012, 2:17 PM

## 2012-12-24 NOTE — Consult Note (Signed)
CARDIOLOGY CONSULT NOTE   Patient ID: Renee Kerr MRN: [id number] DOB/AGE: 1955-07-26 57 y.o.  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 She was in her usual state of health until yesterday when she presented to the ED complaining of chest pain. She was driving home from chruch around 9 pm when she started experiencing left sided "sqeezing" chest pressure that was 7/10 pain and worse with inspiration. This lasted for 20-25 minutes and eased off on its own. No palpitations, SOB, diaphoresis, radiation to arm or jaw. Denied abdominal pain, n/v, lightheadedness, dizziness, presyncope or syncope. In the ER, she had another transient episode, and was given NTG with complete resolution of her symptoms. Her EKG was unremarkable and troponin was negative. Renee Kerr also reports recent episodes of  "heart fluttering." Patient states that she had two episodes of palpitations on Saturday around 3pm. They lasted about 10-15 minutes and resolved on their own. Denies SOB, diaphoresis, chest pain, presyncope or syncope.  She has never had anything like this before. She does not drink alcohol, take any illicit drugs, or smoke- although she did smoke 2ppd for two years as a young woman. The only medications she takes is ibuprofen and vitamin D. She has a family history of heart disease and both of her parents had MIs in their 19s.    Past Medical History  Diagnosis Date  . Obesity   . Joint pain, knee     bilateral  . Chest pain   . Heart palpitations      Past Surgical History  Procedure Laterality Date  . Cesarean section  1986    x2 with both children    No Known Allergies  I have reviewed the patient's current medications . aspirin EC  325 mg Oral  Daily  . docusate sodium  100 mg Oral BID  . enoxaparin (LOVENOX) injection  40 mg Subcutaneous Q24H  . pantoprazole  40 mg Oral Q0600  . sodium chloride  3 mL Intravenous Q12H  . sodium chloride  3 mL Intravenous Q12H     sodium chloride, morphine injection, ondansetron (ZOFRAN) IV, ondansetron, sodium chloride  Prior to Admission medications   Medication Sig Start Date End Date Taking? Authorizing Provider  ibuprofen (ADVIL,MOTRIN) 200 MG tablet Take 400 mg by mouth every 6 (six) hours as needed (pain).    Yes Historical Provider, MD  Vitamin D, Ergocalciferol, (DRISDOL) 50000 UNITS CAPS Take 50,000 Units by mouth every 7 (seven) days. Take on saturdays   Yes Historical Provider, MD     History   Social History  . Marital Status: Married    Spouse Name: Renee Kerr    Number of Children: 2  . Years of Education: N/A   Occupational History  . Bus Driver     Social History Main Topics  . Smoking status: Current Every Day Smoker -- 2.00 packs/day for 2 years  . Smokeless tobacco: Not on file  . Alcohol Use: No  . Drug Use: Not on file  . Sexual Activity: Not on file   Other Topics Concern  . Not on file   Social History Narrative   Lives in [place] with husband. Has two sons one lives in [place] and one lives in [place].  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    No family status information on file.   Family History  Problem Relation Age of Onset  . Heart attack Mother     mid 19s  . Heart attack Father     9, died with first MI  . Diabetes Brother   . Hypertension Brother   . Hypertension Father   . Cancer Maternal Grandmother     cervical cancer     ROS:  Full 14 point review of systems complete and found to be negative unless listed above.  Physical Exam: Blood pressure 122/61, pulse 70, temperature 98.5 F (36.9 C), temperature source Oral, resp. rate 16, height 4\' 11"  (1.499 m), weight 202 lb 6.1 oz (91.8 kg), SpO2 98.00%.  GEN:   no acute distress PSYCH: alert and oriented x3. affect is appropriate.  HEENT: Mucous membranes pink and anicteric; PERRLA; EOM intact; no cervical lymphadenopathy nor thyromegaly or carotid bruit; no JVD CHEST WALL: No tenderness  CHEST: CTAB HEART: Regular rate and rhythm. There are no murmur, rub, or gallops.  ABDOMEN: soft and non-tender; no masses, no organomegaly, normal abdominal bowel sounds  EXTREMITIES: No edema pulses, 2+ and symmetric in all extremities  SKIN: no rashes  CNS: Cranial nerves 2-12 grossly intact  Labs:   Lab Results  Component Value Date   WBC 10.7* 12/23/2012   HGB 12.6 12/23/2012   HCT 37.8 12/23/2012   MCV 79.2 12/23/2012   PLT 208 12/23/2012      Recent Labs Lab 12/23/12 2110  NA 140  K 4.0  CL 104  CO2 27  BUN 14  CREATININE 0.92  CALCIUM 8.9  GLUCOSE 101*    Recent Labs  12/24/12 0110 12/24/12 0650  TROPONINI <0.30 <0.30    Lab Results  Component Value Date   CHOL 97 12/24/2012   HDL 39* 12/24/2012   LDLCALC 31 12/24/2012   TRIG 136 12/24/2012   No results found for this basename: DDIMER   No results found for this basename: Lipase,  amylase   TSH  Date/Time Value Range Status  12/24/2012  1:10 AM 1.953  0.350 - 4.500 uIU/mL Final     Performed at [facility]     Echo: NONE  ECG: 12/23/12 Vent. rate 87 BPM PR interval 124 ms QRS duration 90 ms QT/QTc 366/440 ms P-R-T axes 70 62 61 NSR with occasional PVCs  Radiology:  Dg Chest Port 1 View  12/23/2012   CLINICAL DATA:  Chest pain and shortness of Breath  EXAM: PORTABLE CHEST - 1 VIEW  COMPARISON:  None.  FINDINGS: The heart size and mediastinal contours are within normal limits. Both lungs are clear. The bony thorax is demineralized but grossly intact.  IMPRESSION: No active disease.      ASSESSMENT AND PLAN:    Principal Problem:   Chest pain, atypical Active Problems:   LOW BACK PAIN   Anxiety  Renee Kerr is a 57 year old female with a  history of obesity, joint pain and no prior cardiac history who is being evaluated for today chest pain. Patient was admitted last night for ACS rule out. EKG unremarkable and serial troponin negative.  1. Chest pain: atypical. She has been chest pain free since admission - RF's: Patient is obese, sedentary and has a family history of CAD in 28's. No htn, DM, HLD. Labs inpatient look good.  -EKG unremarkable and serial troponin negative -ECHO pending -Discharge home and stress as  an outpatient-- will discuss with Dr. Antoine Poche      Signed: Thereasa Parkin, PA-C 12/24/2012 1:09 PM   Co-Sign MD  History and all data above reviewed.  Patient examined.  I agree with the findings as above.  The patient exam reveals.  The patients pain is very atypical.  She has no objective evidence of ischemia.   COR:RRR  ,  Lungs: Clear  ,  Abd: Positive bowel sounds, no rebound no guarding, Ext No edema  .  All available labs, radiology testing, previous records reviewed. Agree with documented assessment and plan. Pain is atypical.  Out patient ETT is OK.  We will try to arrange this for Monday so that she does not miss much work.  She can be discharged in anticipation of this.  We can follow up the results of the echo.  Renee Kerr  3:49 PM  12/24/2012

## 2012-12-25 ENCOUNTER — Encounter (HOSPITAL_COMMUNITY): Payer: Self-pay | Admitting: *Deleted

## 2012-12-25 ENCOUNTER — Ambulatory Visit (HOSPITAL_COMMUNITY)
Admit: 2012-12-25 | Discharge: 2012-12-25 | Disposition: A | Discharge status: Home / Self Care | Payer: No Typology Code available for payment source | Attending: Cardiology | Admitting: Cardiology

## 2012-12-25 ENCOUNTER — Other Ambulatory Visit: Payer: Self-pay

## 2012-12-25 DIAGNOSIS — Z8249 Family history of ischemic heart disease and other diseases of the circulatory system: Secondary | ICD-10-CM | POA: Insufficient documentation

## 2012-12-25 DIAGNOSIS — I4949 Other premature depolarization: Secondary | ICD-10-CM | POA: Insufficient documentation

## 2012-12-25 DIAGNOSIS — R079 Chest pain, unspecified: Secondary | ICD-10-CM | POA: Insufficient documentation

## 2012-12-25 DIAGNOSIS — R5381 Other malaise: Secondary | ICD-10-CM | POA: Insufficient documentation

## 2012-12-25 MED ORDER — ASPIRIN EC 81 MG PO TBEC: 81.0000 mg | DELAYED_RELEASE_TABLET | Freq: Every day | ORAL | Status: DC

## 2012-12-25 NOTE — Discharge Summary (Signed)
Physician Discharge Summary  Renee Kerr ZOX:[id number] DOB: Aug 28, 1955 DOA: 12/23/2012  PCP: [name], MD  Admit date: 12/23/2012 Discharge date: 12/25/2012  Time spent: 32 minutes  Recommendations for Outpatient Follow-up:  1. Follow up with cardiology and PCP as recommended.   Discharge Diagnoses:  Principal Problem:   Chest pain, atypical Active Problems:   LOW BACK PAIN   Anxiety   Discharge Condition: improved.  Diet recommendation: regular  Filed Weights   12/23/12 2058 12/24/12 0007  Weight: 92.534 kg (204 lb) 91.8 kg (202 lb 6.1 oz)    History of present illness:  Renee Kerr is an 57 y.o. female with hx of bilateral knee pain, on Ibuprofen, but otherwise healthy, on no other chronic medication, presents to the ER with substernal chest pressure. She was coming back from church and experienced substernal chest pain/pressure. There was slight shortness of breath, but no nausea, vomiting or diaphoresis. Pain has been non pleuritic. Her pain resolved spontaneously upon arrival in the ER lasted about 20 minutes in total time. In the ER, she had another transient episode, and was given NTG with improvement of her symptoms. Her EKG was unremarkable, and her troponin was negative. Her chest Xray was clear. She said both of her parents had MI in their 7's. Hospitalist was asked to admit her for chest pain   Hospital Course:  1. Atypical chest pain: - resolved after arrival to ED.  She was admitted to telemetry.   serial cardiac enzymes are negative.  - echo done and showed Wall thickness was normal. Systolic function was normal. The estimated ejection fraction was in the range of 55% to 60%. Wall motion was normal; there were no regional wall motion abnormalities. Features are consistent with a pseudonormal left ventricular filling pattern, with concomitant abnormal relaxation and increased filling pressure (grade 2 diastolic dysfunction). - cardiology  consulted because of her risk factors and ETT done today does not show any ischemia.  - she was discharged on aspirin 81 mg daily and recommended to follow [facility] cardiology  As recommended.   Procedures:  ETT: negative for ischemia.   Consultations:  Cardiology  Discharge Exam: Filed Vitals:   12/25/12 0456  BP: 119/55  Pulse: 69  Temp: 98.6 F (37 C)  Resp: 18    General: ALERT Afebrile comfortable . Cardiovascular: s1s2 no MRG Respiratory: ctab  Abdomen: soft NT ND BS+   Discharge Instructions  Discharge Orders   Future Orders Complete By Expires   Discharge instructions  As directed    Comments:     Follow up with PCP IN ONE WEEK Follow up with cardiology on [facility]   Discharge instructions  As directed    Comments:     Follow up with PCP in one week  Follow up with cardiology as recommended.       Medication List    STOP taking these medications       ibuprofen 200 MG tablet  Commonly known as:  ADVIL,MOTRIN      TAKE these medications       aspirin EC 81 MG tablet  Take 1 tablet (81 mg total) by mouth daily.     pantoprazole 40 MG tablet  Commonly known as:  PROTONIX  Take 1 tablet (40 mg total) by mouth daily at 6 (six) AM.     Vitamin D (Ergocalciferol) 50000 UNITS Caps capsule  Commonly known as:  DRISDOL  Take 50,000 Units by mouth every 7 (seven) days. Take on saturdays  No Known Allergies     Follow-up Information   Follow up with [name], MD. Schedule an appointment as soon as possible for a visit in 1 day.   Specialty:  Internal Medicine   Contact information:   [address] [place] [address] [phone]       Follow up with Rollene Rotunda, MD. Schedule an appointment as soon as possible for a visit in 1 week.   Specialty:  Cardiology   Contact information:   [address] [place]lyn [place] [address] [phone]        The results of significant  diagnostics from this hospitalization (including imaging, microbiology, ancillary and laboratory) are listed below for reference.    Significant Diagnostic Studies: Dg Chest Port 1 View  12/23/2012   CLINICAL DATA:  Chest pain and shortness of Breath  EXAM: PORTABLE CHEST - 1 VIEW  COMPARISON:  None.  FINDINGS: The heart size and mediastinal contours are within normal limits. Both lungs are clear. The bony thorax is demineralized but grossly intact.  IMPRESSION: No active disease.   Electronically Signed   By: Amie Portland M.D.   On: 12/23/2012 21:23    Microbiology: No results found for this or any previous visit (from the past 240 hour(s)).   Labs: Basic Metabolic Panel:  Recent Labs Lab 12/23/12 2110  NA 140  K 4.0  CL 104  CO2 27  GLUCOSE 101*  BUN 14  CREATININE 0.92  CALCIUM 8.9   Liver Function Tests: No results found for this basename: AST, ALT, ALKPHOS, BILITOT, PROT, ALBUMIN,  in the last 168 hours No results found for this basename: LIPASE, AMYLASE,  in the last 168 hours No results found for this basename: AMMONIA,  in the last 168 hours CBC:  Recent Labs Lab 12/23/12 2110  WBC 10.7*  HGB 12.6  HCT 37.8  MCV 79.2  PLT 208   Cardiac Enzymes:  Recent Labs Lab 12/24/12 0110 12/24/12 0650 12/24/12 1247  TROPONINI <0.30 <0.30 <0.30   BNP: BNP (last 3 results) No results found for this basename: PROBNP,  in the last 8760 hours CBG: No results found for this basename: GLUCAP,  in the last 168 hours     Signed:  Keiyana Stehr  [facility] 12/25/2012, 2:25 PM

## 2012-12-25 NOTE — Progress Notes (Signed)
Exercise Treadmill Test  Patient exercised 7:00. ECG:  There was insignificant up-sloping ST segment depression No ST changes to suggest ischemia. Symptoms:  There was no chest pain. There were very frequent PVCs that persisted during exercise.  One couplet noted.  She was asymptomatic.   BP response to exercise was probably normal.  Noise interference during exercise affected BP readings.  But, BP at end of exercise was increased. Impression:  Clinically Negative.  Electrically Negative. Echocardiogram pending.   SignedTereso Newcomer, PA-C   12/25/2012 10:18 AM     Pager # [phone]

## 2012-12-25 NOTE — Progress Notes (Addendum)
ETT this am at [facility] and if no ischemia then ok to d/c home

## 2013-01-11 ENCOUNTER — Encounter: Payer: No Typology Code available for payment source | Admitting: Physician Assistant

## 2013-01-13 ENCOUNTER — Encounter: Payer: Self-pay | Admitting: Physician Assistant

## 2013-06-13 ENCOUNTER — Emergency Department (HOSPITAL_COMMUNITY): Payer: No Typology Code available for payment source

## 2013-06-13 ENCOUNTER — Emergency Department (HOSPITAL_COMMUNITY)
Admission: EM | Admit: 2013-06-13 | Discharge: 2013-06-13 | Disposition: A | Discharge status: Home / Self Care | Payer: No Typology Code available for payment source | Attending: Emergency Medicine | Admitting: Emergency Medicine

## 2013-06-13 ENCOUNTER — Encounter (HOSPITAL_COMMUNITY): Payer: Self-pay | Admitting: Emergency Medicine

## 2013-06-13 DIAGNOSIS — F17200 Nicotine dependence, unspecified, uncomplicated: Secondary | ICD-10-CM | POA: Insufficient documentation

## 2013-06-13 DIAGNOSIS — E669 Obesity, unspecified: Secondary | ICD-10-CM | POA: Insufficient documentation

## 2013-06-13 DIAGNOSIS — R0789 Other chest pain: Secondary | ICD-10-CM | POA: Insufficient documentation

## 2013-06-13 DIAGNOSIS — J45901 Unspecified asthma with (acute) exacerbation: Secondary | ICD-10-CM | POA: Insufficient documentation

## 2013-06-13 DIAGNOSIS — Z7982 Long term (current) use of aspirin: Secondary | ICD-10-CM | POA: Insufficient documentation

## 2013-06-13 DIAGNOSIS — Z79899 Other long term (current) drug therapy: Secondary | ICD-10-CM | POA: Insufficient documentation

## 2013-06-13 HISTORY — DX: Unspecified asthma, uncomplicated: J45.909

## 2013-06-13 LAB — BASIC METABOLIC PANEL
BUN: 13 mg/dL (ref 6–23)
CHLORIDE: 105 meq/L (ref 96–112)
CO2: 27 meq/L (ref 19–32)
CREATININE: 0.84 mg/dL (ref 0.50–1.10)
Calcium: 8.7 mg/dL (ref 8.4–10.5)
GFR calc Af Amer: 86 mL/min — ABNORMAL LOW (ref >90)
GFR calc non Af Amer: 75 mL/min — ABNORMAL LOW (ref >90)
GLUCOSE: 87 mg/dL (ref 70–99)
Potassium: 4.3 mEq/L (ref 3.7–5.3)
Sodium: 144 mEq/L (ref 137–147)

## 2013-06-13 LAB — CBC
HEMATOCRIT: 39.3 % (ref 36.0–46.0)
Hemoglobin: 12.7 g/dL (ref 12.0–15.0)
MCH: 25.8 pg — AB (ref 26.0–34.0)
MCHC: 32.3 g/dL (ref 30.0–36.0)
MCV: 79.7 fL (ref 78.0–100.0)
Platelets: 205 10*3/uL (ref 150–400)
RBC: 4.93 MIL/uL (ref 3.87–5.11)
RDW: 14.7 % (ref 11.5–15.5)
WBC: 8.6 10*3/uL (ref 4.0–10.5)

## 2013-06-13 LAB — I-STAT TROPONIN, ED: Troponin i, poc: 0 ng/mL (ref 0.00–0.08)

## 2013-06-13 MED ORDER — PREDNISONE 20 MG PO TABS: 60.0000 mg | ORAL_TABLET | Freq: Every day | ORAL | Status: DC

## 2013-06-13 MED ORDER — PREDNISONE 20 MG PO TABS
60.0000 mg | ORAL_TABLET | Freq: Once | ORAL | Status: AC
  Administered 2013-06-13: 60 mg via ORAL
  Filled 2013-06-13: qty 3

## 2013-06-13 MED ORDER — IPRATROPIUM-ALBUTEROL 0.5-2.5 (3) MG/3ML IN SOLN
3.0000 mL | Freq: Once | RESPIRATORY_TRACT | Status: AC
  Administered 2013-06-13: 3 mL via RESPIRATORY_TRACT
  Filled 2013-06-13: qty 3

## 2013-06-13 MED ORDER — ALBUTEROL SULFATE HFA 108 (90 BASE) MCG/ACT IN AERS: 2.0000 | INHALATION_SPRAY | RESPIRATORY_TRACT | Status: DC | PRN

## 2013-06-13 NOTE — ED Provider Notes (Signed)
CSN: [id number]     Arrival date & time 06/13/13  1654 History   First MD Initiated Contact with Patient 06/13/13 1720     Chief Complaint  Patient presents with  . Shortness of Breath     (Consider location/radiation/quality/duration/timing/severity/associated sxs/prior Treatment) HPI  Patient presents with shortness of breath and chest tightness. Reports a history of asthma but hasn't had an attack in "many years." She reports onset of chest tightness yesterday. She took her albuterol inhaler at home with some improvement. She feels that she was wheezing earlier today. She denies any fever but does endorse frequent cough. Trigger for her asthma is usually change in the seasons.  Past Medical History  Diagnosis Date  . Obesity   . Joint pain, knee     bilateral  . Chest pain   . Heart palpitations   . Asthma    Past Surgical History  Procedure Laterality Date  . Cesarean section  221986    x2 with both children   Family History  Problem Relation Age of Onset  . Heart attack Mother     mid 9150s  . Heart attack Father     458, died with first MI  . Diabetes Brother   . Hypertension Brother   . Hypertension Father   . Cancer Maternal Grandmother     cervical cancer   History  Substance Use Topics  . Smoking status: Current Every Day Smoker -- 2.00 packs/day for 2 years  . Smokeless tobacco: Not on file  . Alcohol Use: No   OB History   Grav Para Term Preterm Abortions TAB SAB Ect Mult Living                 Review of Systems  Constitutional: Negative for fever.  Respiratory: Positive for cough, chest tightness and shortness of breath.   Cardiovascular: Positive for chest pain.  Gastrointestinal: Negative for abdominal pain.  Genitourinary: Negative for dysuria.  Musculoskeletal: Negative for back pain.  Psychiatric/Behavioral: Negative for confusion.  All other systems reviewed and are negative.     Allergies  Review of patient's allergies indicates no known  allergies.  Home Medications   Prior to Admission medications   Medication Sig Start Date End Date Taking? Authorizing Provider  aspirin EC 81 MG tablet Take 81 mg by mouth daily. 12/25/12  Yes Kathlen ModyVijaya Akula, MD  ibuprofen (ADVIL,MOTRIN) 200 MG tablet Take 200 mg by mouth every 6 (six) hours as needed for mild pain.   Yes Historical Provider, MD  Vitamin D, Ergocalciferol, (DRISDOL) 50000 UNITS CAPS Take 50,000 Units by mouth every 7 (seven) days. Take on saturdays   Yes Historical Provider, MD  albuterol (PROVENTIL HFA;VENTOLIN HFA) 108 (90 BASE) MCG/ACT inhaler Inhale 2 puffs into the lungs every 4 (four) hours as needed for wheezing or shortness of breath. 06/13/13   Shon Batonourtney F Anaalicia Reimann, MD  predniSONE (DELTASONE) 20 MG tablet Take 3 tablets (60 mg total) by mouth daily with breakfast. 06/13/13   Shon Batonourtney F Debra Calabretta, MD   BP 104/60  Pulse 74  Temp(Src) 98 F (36.7 C) (Oral)  Resp 18  SpO2 97% Physical Exam  Nursing note and vitals reviewed. Constitutional: She is oriented to person, place, and time. She appears well-developed and well-nourished. No distress.  HENT:  Head: Normocephalic and atraumatic.  Cardiovascular: Normal rate, regular rhythm and normal heart sounds.   No murmur heard. Pulmonary/Chest: Effort normal. No respiratory distress. She has wheezes.  Scant expiratory wheeze  Abdominal: Soft.  Bowel sounds are normal. There is no tenderness.  Musculoskeletal: She exhibits no edema.  Neurological: She is alert and oriented to person, place, and time.  Skin: Skin is warm and dry.  Psychiatric: She has a normal mood and affect.    ED Course  Procedures (including critical care time) Labs Review Labs Reviewed  BASIC METABOLIC PANEL - Abnormal; Notable for the following:    GFR calc non Af Amer 75 (*)    GFR calc Af Amer 86 (*)    All other components within normal limits  CBC - Abnormal; Notable for the following:    MCH 25.8 (*)    All other components within normal  limits  I-STAT TROPOININ, ED    Imaging Review Dg Chest 2 View (if Patient Has Fever And/or Copd)  06/13/2013   CLINICAL DATA:  Shortness of breath  EXAM: CHEST  2 VIEW  COMPARISON:  12/23/2012  FINDINGS: Lungs are clear.  No pleural effusion or pneumothorax.  The heart is normal size.  Degenerative changes of the visualized thoracolumbar spine.  IMPRESSION: No evidence of acute cardiopulmonary disease.   Electronically Signed   By: Charline BillsSriyesh  Krishnan M.D.   On: 06/13/2013 18:03     EKG Interpretation   Date/Time:  Monday Jun 13 2013 19:23:25 EDT Ventricular Rate:  72 PR Interval:  124 QRS Duration: 92 QT Interval:  407 QTC Calculation: 445 R Axis:   44 Text Interpretation:  Sinus rhythm Abnormal R-wave progression, early  transition Confirmed by Courtny Bennison  MD, Patty Leitzke ([id number]) on 06/13/2013 7:40:12  PM      MDM   Final diagnoses:  Asthma exacerbation, mild   Patient presents for chest tightness and shortness of breath. She states it consistent with an asthma attack. She has only scant wheezing on exam with fair movement. Patient given DuoNeb and prednisone. Screening chest x-ray without evidence of infiltrate. EKG is nonischemic and troponin is negative. Patient reports improvement following DuoNeb. Pulse ox is 99%. Patient will be discharged with prednisone.  After history, exam, and medical workup I feel the patient has been appropriately medically screened and is safe for discharge home. Pertinent diagnoses were discussed with the patient. Patient was given return precautions.     Shon Batonourtney F Orson Rho, MD 06/13/13 [phone]

## 2013-06-13 NOTE — ED Notes (Signed)
Patient refused EKG.

## 2013-06-13 NOTE — ED Notes (Signed)
Pt has hx of asthma. Began to have sob yesterday am, pt states it feels like her asthma although she has not had attack in some time. Pt used home asthma medication prior to arrival. No wheezing noted at present, although pt heard wheezing earlier today.

## 2013-06-13 NOTE — ED Notes (Signed)
Signature pad not operable. Patient acknowledges discharge instructions.

## 2013-06-13 NOTE — Discharge Instructions (Signed)
Asthma, Adult Asthma is a recurring condition in which the airways tighten and narrow. Asthma can make it difficult to breathe. It can cause coughing, wheezing, and shortness of breath. Asthma episodes (also called asthma attacks) range from minor to life-threatening. Asthma cannot be cured, but medicines and lifestyle changes can help control it. CAUSES Asthma is believed to be caused by inherited (genetic) and environmental factors, but its exact cause is unknown. Asthma may be triggered by allergens, lung infections, or irritants in the air. Asthma triggers are different for each person. Common triggers include:   Animal dander.  Dust mites.  Cockroaches.  Pollen from trees or grass.  Mold.  Smoke.  Air pollutants such as dust, household cleaners, hair sprays, aerosol sprays, paint fumes, strong chemicals, or strong odors.  Cold air, weather changes, and winds (which increase molds and pollens in the air).  Strong emotional expressions such as crying or laughing hard.  Stress.  Certain medicines (such as aspirin) or types of drugs (such as beta-blockers).  Sulfites in foods and drinks. Foods and drinks that may contain sulfites include dried fruit, potato chips, and sparkling grape juice.  Infections or inflammatory conditions such as the flu, a cold, or an inflammation of the nasal membranes (rhinitis).  Gastroesophageal reflux disease (GERD).  Exercise or strenuous activity. SYMPTOMS Symptoms may occur immediately after asthma is triggered or many hours later. Symptoms include:  Wheezing.  Excessive nighttime or early morning coughing.  Frequent or severe coughing with a common cold.  Chest tightness.  Shortness of breath. DIAGNOSIS  The diagnosis of asthma is made by a review of your medical history and a physical exam. Tests may also be performed. These may include:  Lung function studies. These tests show how much air you breath in and out.  Allergy  tests.  Imaging tests such as X-rays. TREATMENT  Asthma cannot be cured, but it can usually be controlled. Treatment involves identifying and avoiding your asthma triggers. It also involves medicines. There are 2 classes of medicine used for asthma treatment:   Controller medicines. These prevent asthma symptoms from occurring. They are usually taken every day.  Reliever or rescue medicines. These quickly relieve asthma symptoms. They are used as needed and provide short-term relief. Your health care provider will help you create an asthma action plan. An asthma action plan is a written plan for managing and treating your asthma attacks. It includes a list of your asthma triggers and how they may be avoided. It also includes information on when medicines should be taken and when their dosage should be changed. An action plan may also involve the use of a device called a peak flow meter. A peak flow meter measures how well the lungs are working. It helps you monitor your condition. HOME CARE INSTRUCTIONS   Take medicine as directed by your health care provider. Speak with your health care provider if you have questions about how or when to take the medicines.  Use a peak flow meter as directed by your health care provider. Record and keep track of readings.  Understand and use the action plan to help minimize or stop an asthma attack without needing to seek medical care.  Control your home environment in the following ways to help prevent asthma attacks:  Do not smoke. Avoid being exposed to secondhand smoke.  Change your heating and air conditioning filter regularly.  Limit your use of fireplaces and wood stoves.  Get rid of pests (such as roaches and   mice) and their droppings.  Throw away plants if you see mold on them.  Clean your floors and dust regularly. Use unscented cleaning products.  Try to have someone else vacuum for you regularly. Stay out of rooms while they are being  vacuumed and for a short while afterward. If you vacuum, use a dust mask from a hardware store, a double-layered or microfilter vacuum cleaner bag, or a vacuum cleaner with a HEPA filter.  Replace carpet with wood, tile, or vinyl flooring. Carpet can trap dander and dust.  Use allergy-proof pillows, mattress covers, and box spring covers.  Wash bed sheets and blankets every week in hot water and dry them in a dryer.  Use blankets that are made of polyester or cotton.  Clean bathrooms and kitchens with bleach. If possible, have someone repaint the walls in these rooms with mold-resistant paint. Keep out of the rooms that are being cleaned and painted.  Wash hands frequently. SEEK MEDICAL CARE IF:   You have wheezing, shortness of breath, or a cough even if taking medicine to prevent attacks.  The colored mucus you cough up (sputum) is thicker than usual.  Your sputum changes from clear or white to yellow, green, gray, or bloody.  You have any problems that may be related to the medicines you are taking (such as a rash, itching, swelling, or trouble breathing).  You are using a reliever medicine more than 2 3 times per week.  Your peak flow is still at 50 79% of you personal best after following your action plan for 1 hour. SEEK IMMEDIATE MEDICAL CARE IF:   You seem to be getting worse and are unresponsive to treatment during an asthma attack.  You are short of breath even at rest.  You get short of breath when doing very little physical activity.  You have difficulty eating, drinking, or talking due to asthma symptoms.  You develop chest pain.  You develop a fast heartbeat.  You have a bluish color to your lips or fingernails.  You are lightheaded, dizzy, or faint.  Your peak flow is less than 50% of your personal best.  You have a fever or persistent symptoms for more than 2 3 days.  You have a fever and symptoms suddenly get worse. MAKE SURE YOU:   Understand these  instructions.  Will watch your condition.  Will get help right away if you are not doing well or get worse. Document Released: 01/27/2005 Document Revised: 09/29/2012 Document Reviewed: 08/26/2012 [employer] Patient Information 2014 [employer].  

## 2014-02-13 ENCOUNTER — Encounter (HOSPITAL_COMMUNITY): Payer: Self-pay | Admitting: Emergency Medicine

## 2014-02-13 ENCOUNTER — Emergency Department (INDEPENDENT_AMBULATORY_CARE_PROVIDER_SITE_OTHER)
Admission: EM | Admit: 2014-02-13 | Discharge: 2014-02-13 | Disposition: A | Discharge status: Home / Self Care | Payer: No Typology Code available for payment source | Source: Home / Self Care | Attending: Family Medicine | Admitting: Family Medicine

## 2014-02-13 DIAGNOSIS — R42 Dizziness and giddiness: Secondary | ICD-10-CM

## 2014-02-13 DIAGNOSIS — G44209 Tension-type headache, unspecified, not intractable: Secondary | ICD-10-CM

## 2014-02-13 MED ORDER — IPRATROPIUM BROMIDE 0.06 % NA SOLN: 2.0000 | Freq: Four times a day (QID) | NASAL | Status: DC

## 2014-02-13 MED ORDER — MECLIZINE HCL 12.5 MG PO TABS: 12.5000 mg | ORAL_TABLET | Freq: Three times a day (TID) | ORAL | Status: DC | PRN

## 2014-02-13 NOTE — Discharge Instructions (Signed)
Thank you for coming in today. Take Atrovent nasal spray Use meclizine as needed. Do not drive after taking this medication. Follow-up with primary care provider soon Go to the emergency room if your headache becomes excruciating or you have weakness or numbness or uncontrolled vomiting.  Look up Epley repositioning maneuver. Your problem is left sidded.     Benign Positional Vertigo Vertigo means you feel like you or your surroundings are moving when they are not. Benign positional vertigo is the most common form of vertigo. Benign means that the cause of your condition is not serious. Benign positional vertigo is more common in older adults. CAUSES  Benign positional vertigo is the result of an upset in the labyrinth system. This is an area in the middle ear that helps control your balance. This may be caused by a viral infection, head injury, or repetitive motion. However, often no specific cause is found. SYMPTOMS  Symptoms of benign positional vertigo occur when you move your head or eyes in different directions. Some of the symptoms may include:  Loss of balance and falls.  Vomiting.  Blurred vision.  Dizziness.  Nausea.  Involuntary eye movements (nystagmus). DIAGNOSIS  Benign positional vertigo is usually diagnosed by physical exam. If the specific cause of your benign positional vertigo is unknown, your caregiver may perform imaging tests, such as magnetic resonance imaging (MRI) or computed tomography (CT). TREATMENT  Your caregiver may recommend movements or procedures to correct the benign positional vertigo. Medicines such as meclizine, benzodiazepines, and medicines for nausea may be used to treat your symptoms. In rare cases, if your symptoms are caused by certain conditions that affect the inner ear, you may need surgery. HOME CARE INSTRUCTIONS   Follow your caregiver's instructions.  Move slowly. Do not make sudden body or head movements.  Avoid driving.  Avoid  operating heavy machinery.  Avoid performing any tasks that would be dangerous to you or others during a vertigo episode.  Drink enough fluids to keep your urine clear or pale yellow. SEEK IMMEDIATE MEDICAL CARE IF:   You develop problems with walking, weakness, numbness, or using your arms, hands, or legs.  You have difficulty speaking.  You develop severe headaches.  Your nausea or vomiting continues or gets worse.  You develop visual changes.  Your family or friends notice any behavioral changes.  Your condition gets worse.  You have a fever.  You develop a stiff neck or sensitivity to light. MAKE SURE YOU:   Understand these instructions.  Will watch your condition.  Will get help right away if you are not doing well or get worse. Document Released: 11/04/2005 Document Revised: 04/21/2011 Document Reviewed: 10/17/2010 [facility] Patient Information 2015 [place], [place]. This information is not intended to replace advice given to you by your health care provider. Make sure you discuss any questions you have with your health care provider.

## 2014-02-13 NOTE — ED Notes (Signed)
C/o headache, equilibrium is off per patient.  Dizziness is worse today.  Reports room spins when lying down.

## 2014-02-13 NOTE — ED Provider Notes (Signed)
Renee Kerr is a 59 y.o. female who presents to Urgent Care today for dizziness and mild headache. Patient has experienced vertigo off and on for the past 1-1/2-2 weeks. Symptoms are worse when she lays down on her left side. She denies any vomiting or diarrhea. The vertigo is intense and last for about 30 seconds. Additionally she notes mild facial pain and pressure associated with a headache. She has tried ibuprofen which helps. She's never been diagnosed with hypertension and has had normal blood pressures previously. No weakness or numbness or loss of function trouble speaking or swallowing.   Past Medical History  Diagnosis Date  . Obesity   . Joint pain, knee     bilateral  . Chest pain   . Heart palpitations   . Asthma    Past Surgical History  Procedure Laterality Date  . Cesarean section  1986    x2 with both children   History  Substance Use Topics  . Smoking status: Current Every Day Smoker -- 2.00 packs/day for 2 years  . Smokeless tobacco: Not on file  . Alcohol Use: No   ROS as above Medications: No current facility-administered medications for this encounter.   Current Outpatient Prescriptions  Medication Sig Dispense Refill  . albuterol (PROVENTIL HFA;VENTOLIN HFA) 108 (90 BASE) MCG/ACT inhaler Inhale 2 puffs into the lungs every 4 (four) hours as needed for wheezing or shortness of breath. 1 Inhaler 0  . aspirin EC 81 MG tablet Take 81 mg by mouth daily.    Marland Kitchen ibuprofen (ADVIL,MOTRIN) 200 MG tablet Take 200 mg by mouth every 6 (six) hours as needed for mild pain.    Marland Kitchen ipratropium (ATROVENT) 0.06 % nasal spray Place 2 sprays into both nostrils 4 (four) times daily. 15 mL 1  . meclizine (ANTIVERT) 12.5 MG tablet Take 1 tablet (12.5 mg total) by mouth 3 (three) times daily as needed. 30 tablet 0  . predniSONE (DELTASONE) 20 MG tablet Take 3 tablets (60 mg total) by mouth daily with breakfast. 12 tablet 0  . Vitamin D, Ergocalciferol, (DRISDOL) 50000 UNITS CAPS  Take 50,000 Units by mouth every 7 (seven) days. Take on saturdays     No Known Allergies   Exam:  BP 155/82 mmHg  Pulse 68  Temp(Src) 98.2 F (36.8 C) (Oral)  Resp 18  SpO2 97% Gen: Well NAD HEENT: EOMI,  MMM posterior pharynx with cobblestoning. Normal tympanic membranes bilaterally. Mildly tender palpation bilateral medullary sinuses. Clear nasal discharge. Lungs: Normal work of breathing. CTABL Heart: RRR no MRG Abd: NABS, Soft. Nondistended, Nontender Exts: Brisk capillary refill, warm and well perfused.  Neuro: Positive left sided Dix Hallpike maneuver with nystagmus. Normal right. Cranial nerves II through XII are normal otherwise. Normal coordination strength sensation reflexes gait and balance  No results found for this or any previous visit (from the past 24 hour(s)). No results found.  Assessment and Plan: 59 y.o. female with  1) vertigo: Likely BPPV treatment with meclizine and Epley maneuver. Refer to vestibular rehabilitation 2) headache: Sinus related Atrovent nasal spray Tylenol follow-up with PCP 3) blood pressure: Previously normal likely related to NSAID use. Follow up with PCP  Discussed warning signs or symptoms. Please see discharge instructions. Patient expresses understanding.     Rodolph Bong, MD 02/13/14 2003

## 2014-05-06 ENCOUNTER — Emergency Department (HOSPITAL_COMMUNITY)
Admission: EM | Admit: 2014-05-06 | Discharge: 2014-05-06 | Disposition: A | Discharge status: Home / Self Care | Payer: No Typology Code available for payment source | Attending: Emergency Medicine | Admitting: Emergency Medicine

## 2014-05-06 ENCOUNTER — Encounter (HOSPITAL_COMMUNITY): Payer: Self-pay | Admitting: Emergency Medicine

## 2014-05-06 DIAGNOSIS — E669 Obesity, unspecified: Secondary | ICD-10-CM | POA: Diagnosis not present

## 2014-05-06 DIAGNOSIS — Z7952 Long term (current) use of systemic steroids: Secondary | ICD-10-CM | POA: Insufficient documentation

## 2014-05-06 DIAGNOSIS — Z7951 Long term (current) use of inhaled steroids: Secondary | ICD-10-CM | POA: Diagnosis not present

## 2014-05-06 DIAGNOSIS — Z8739 Personal history of other diseases of the musculoskeletal system and connective tissue: Secondary | ICD-10-CM | POA: Insufficient documentation

## 2014-05-06 DIAGNOSIS — J45901 Unspecified asthma with (acute) exacerbation: Secondary | ICD-10-CM | POA: Insufficient documentation

## 2014-05-06 DIAGNOSIS — Z87891 Personal history of nicotine dependence: Secondary | ICD-10-CM | POA: Insufficient documentation

## 2014-05-06 DIAGNOSIS — Z79899 Other long term (current) drug therapy: Secondary | ICD-10-CM | POA: Diagnosis not present

## 2014-05-06 DIAGNOSIS — R0982 Postnasal drip: Secondary | ICD-10-CM | POA: Insufficient documentation

## 2014-05-06 DIAGNOSIS — J309 Allergic rhinitis, unspecified: Secondary | ICD-10-CM | POA: Insufficient documentation

## 2014-05-06 DIAGNOSIS — Z7982 Long term (current) use of aspirin: Secondary | ICD-10-CM | POA: Diagnosis not present

## 2014-05-06 DIAGNOSIS — J302 Other seasonal allergic rhinitis: Secondary | ICD-10-CM

## 2014-05-06 DIAGNOSIS — R0981 Nasal congestion: Secondary | ICD-10-CM

## 2014-05-06 MED ORDER — ALBUTEROL SULFATE HFA 108 (90 BASE) MCG/ACT IN AERS
2.0000 | INHALATION_SPRAY | Freq: Once | RESPIRATORY_TRACT | Status: AC
  Administered 2014-05-06: 2 via RESPIRATORY_TRACT
  Filled 2014-05-06: qty 6.7

## 2014-05-06 NOTE — Discharge Instructions (Signed)
Use albuterol inhaler every 4-6 hours as needed for cough and wheezing. Continue to use her Flonase along with nasal saline and over-the-counter allergy medication. Follow-up with your primary care physician within one week.  Allergic Rhinitis Allergic rhinitis is when the mucous membranes in the nose respond to allergens. Allergens are particles in the air that cause your body to have an allergic reaction. This causes you to release allergic antibodies. Through a chain of events, these eventually cause you to release histamine into the blood stream. Although meant to protect the body, it is this release of histamine that causes your discomfort, such as frequent sneezing, congestion, and an itchy, runny nose.  CAUSES  Seasonal allergic rhinitis (hay fever) is caused by pollen allergens that may come from grasses, trees, and weeds. Year-round allergic rhinitis (perennial allergic rhinitis) is caused by allergens such as house dust mites, pet dander, and mold spores.  SYMPTOMS   Nasal stuffiness (congestion).  Itchy, runny nose with sneezing and tearing of the eyes. DIAGNOSIS  Your health care provider can help you determine the allergen or allergens that trigger your symptoms. If you and your health care provider are unable to determine the allergen, skin or blood testing may be used. TREATMENT  Allergic rhinitis does not have a cure, but it can be controlled by:  Medicines and allergy shots (immunotherapy).  Avoiding the allergen. Hay fever may often be treated with antihistamines in pill or nasal spray forms. Antihistamines block the effects of histamine. There are over-the-counter medicines that may help with nasal congestion and swelling around the eyes. Check with your health care provider before taking or giving this medicine.  If avoiding the allergen or the medicine prescribed do not work, there are many new medicines your health care provider can prescribe. Stronger medicine may be used  if initial measures are ineffective. Desensitizing injections can be used if medicine and avoidance does not work. Desensitization is when a patient is given ongoing shots until the body becomes less sensitive to the allergen. Make sure you follow up with your health care provider if problems continue. HOME CARE INSTRUCTIONS It is not possible to completely avoid allergens, but you can reduce your symptoms by taking steps to limit your exposure to them. It helps to know exactly what you are allergic to so that you can avoid your specific triggers. SEEK MEDICAL CARE IF:   You have a fever.  You develop a cough that does not stop easily (persistent).  You have shortness of breath.  You start wheezing.  Symptoms interfere with normal daily activities. Document Released: 10/22/2000 Document Revised: 02/01/2013 Document Reviewed: 10/04/2012 [facility] Patient Information 2015 [place], [place]. This information is not intended to replace advice given to you by your health care provider. Make sure you discuss any questions you have with your health care provider.  Allergies Allergies may happen from anything your body is sensitive to. This may be food, medicines, pollens, chemicals, and nearly anything around you in everyday life that produces allergens. An allergen is anything that causes an allergy producing substance. Heredity is often a factor in causing these problems. This means you may have some of the same allergies as your parents. Food allergies happen in all age groups. Food allergies are some of the most severe and life threatening. Some common food allergies are cow's milk, seafood, eggs, nuts, wheat, and soybeans. SYMPTOMS   Swelling around the mouth.  An itchy red rash or hives.  Vomiting or diarrhea.  Difficulty breathing.  SEVERE ALLERGIC REACTIONS ARE LIFE-THREATENING. This reaction is called anaphylaxis. It can cause the mouth and throat to swell and cause difficulty with  breathing and swallowing. In severe reactions only a trace amount of food (for example, peanut oil in a salad) may cause death within seconds. Seasonal allergies occur in all age groups. These are seasonal because they usually occur during the same season every year. They may be a reaction to molds, grass pollens, or tree pollens. Other causes of problems are house dust mite allergens, pet dander, and mold spores. The symptoms often consist of nasal congestion, a runny itchy nose associated with sneezing, and tearing itchy eyes. There is often an associated itching of the mouth and ears. The problems happen when you come in contact with pollens and other allergens. Allergens are the particles in the air that the body reacts to with an allergic reaction. This causes you to release allergic antibodies. Through a chain of events, these eventually cause you to release histamine into the blood stream. Although it is meant to be protective to the body, it is this release that causes your discomfort. This is why you were given anti-histamines to feel better. If you are unable to pinpoint the offending allergen, it may be determined by skin or blood testing. Allergies cannot be cured but can be controlled with medicine. Hay fever is a collection of all or some of the seasonal allergy problems. It may often be treated with simple over-the-counter medicine such as diphenhydramine. Take medicine as directed. Do not drink alcohol or drive while taking this medicine. Check with your caregiver or package insert for child dosages. If these medicines are not effective, there are many new medicines your caregiver can prescribe. Stronger medicine such as nasal spray, eye drops, and corticosteroids may be used if the first things you try do not work well. Other treatments such as immunotherapy or desensitizing injections can be used if all else fails. Follow up with your caregiver if problems continue. These seasonal allergies are  usually not life threatening. They are generally more of a nuisance that can often be handled using medicine. HOME CARE INSTRUCTIONS   If unsure what causes a reaction, keep a diary of foods eaten and symptoms that follow. Avoid foods that cause reactions.  If hives or rash are present:  Take medicine as directed.  You may use an over-the-counter antihistamine (diphenhydramine) for hives and itching as needed.  Apply cold compresses (cloths) to the skin or take baths in cool water. Avoid hot baths or showers. Heat will make a rash and itching worse.  If you are severely allergic:  Following a treatment for a severe reaction, hospitalization is often required for closer follow-up.  Wear a medic-alert bracelet or necklace stating the allergy.  You and your family must learn how to give adrenaline or use an anaphylaxis kit.  If you have had a severe reaction, always carry your anaphylaxis kit or EpiPen with you. Use this medicine as directed by your caregiver if a severe reaction is occurring. Failure to do so could have a fatal outcome. SEEK MEDICAL CARE IF:  You suspect a food allergy. Symptoms generally happen within 30 minutes of eating a food.  Your symptoms have not gone away within 2 days or are getting worse.  You develop new symptoms.  You want to retest yourself or your child with a food or drink you think causes an allergic reaction. Never do this if an anaphylactic reaction to that food  or drink has happened before. Only do this under the care of a caregiver. SEEK IMMEDIATE MEDICAL CARE IF:   You have difficulty breathing, are wheezing, or have a tight feeling in your chest or throat.  You have a swollen mouth, or you have hives, swelling, or itching all over your body.  You have had a severe reaction that has responded to your anaphylaxis kit or an EpiPen. These reactions may return when the medicine has worn off. These reactions should be considered life  threatening. MAKE SURE YOU:   Understand these instructions.  Will watch your condition.  Will get help right away if you are not doing well or get worse. Document Released: 04/22/2002 Document Revised: 05/24/2012 Document Reviewed: 09/27/2007 [facility] Patient Information 2015 [place], [place]. This information is not intended to replace advice given to you by your health care provider. Make sure you discuss any questions you have with your health care provider.

## 2014-05-06 NOTE — ED Notes (Addendum)
Pt from home sts that she has asthma but does not have medications. Pt reports SOB- 98% RA. Pt in NAD and is A&O. Pt lung sounds are clear bilaterally

## 2014-05-06 NOTE — ED Provider Notes (Signed)
CSN: [id number]     Arrival date & time 05/06/14  1428 History   First MD Initiated Contact with Patient 05/06/14 1441    This chart was scribed for non-physician practitioner, Celene Skeenobyn Jaydalynn Olivero, PA-C working with No att. providers found by Marica OtterNusrat Rahman, ED Scribe. This patient was seen in room WTR6/WTR6 and the patient's care was started at 2:51 PM.  Chief Complaint  Patient presents with  . Asthma   The history is provided by the patient. No language interpreter was used.   PCP: Rogelia BogaKWIATKOWSKI,PETER FRANK, MD HPI Comments: Renee Kerr is a 59 y.o. female, with PMH noted below, who presents to the Emergency Department complaining of cough with associated mild wheezing, postnasal drip, mild SOB with cough and chest tightness onset 3 days ago. Symptoms only present when outside near pollen. Pt reports taking Claritin-D and Flonase at home with minimal relief. Pt reports she has a Hx of asthma, but is out of her asthma meds. Pt denies chest pain, fever.   Past Medical History  Diagnosis Date  . Obesity   . Joint pain, knee     bilateral  . Chest pain   . Heart palpitations   . Asthma    Past Surgical History  Procedure Laterality Date  . Cesarean section  751986    x2 with both children   Family History  Problem Relation Age of Onset  . Heart attack Mother     mid 2950s  . Heart attack Father     8458, died with first MI  . Diabetes Brother   . Hypertension Brother   . Hypertension Father   . Cancer Maternal Grandmother     cervical cancer   History  Substance Use Topics  . Smoking status: Former Smoker -- 2.00 packs/day for 2 years    Quit date: 05/05/1984  . Smokeless tobacco: Not on file  . Alcohol Use: No   OB History    No data available     Review of Systems  Constitutional: Negative for fever and chills.  HENT: Positive for postnasal drip.   Respiratory: Positive for cough, chest tightness, shortness of breath and wheezing.   Cardiovascular: Negative for chest pain.   Psychiatric/Behavioral: Negative for confusion.  All other systems reviewed and are negative.  Allergies  Review of patient's allergies indicates no known allergies.  Home Medications   Prior to Admission medications   Medication Sig Start Date End Date Taking? Authorizing Provider  albuterol (PROVENTIL HFA;VENTOLIN HFA) 108 (90 BASE) MCG/ACT inhaler Inhale 2 puffs into the lungs every 4 (four) hours as needed for wheezing or shortness of breath. 06/13/13   Shon Batonourtney F Horton, MD  aspirin EC 81 MG tablet Take 81 mg by mouth daily. 12/25/12   Kathlen ModyVijaya Akula, MD  ibuprofen (ADVIL,MOTRIN) 200 MG tablet Take 200 mg by mouth every 6 (six) hours as needed for mild pain.    Historical Provider, MD  ipratropium (ATROVENT) 0.06 % nasal spray Place 2 sprays into both nostrils 4 (four) times daily. 02/13/14   Rodolph BongEvan S Corey, MD  meclizine (ANTIVERT) 12.5 MG tablet Take 1 tablet (12.5 mg total) by mouth 3 (three) times daily as needed. 02/13/14   Rodolph BongEvan S Corey, MD  predniSONE (DELTASONE) 20 MG tablet Take 3 tablets (60 mg total) by mouth daily with breakfast. 06/13/13   Shon Batonourtney F Horton, MD  Vitamin D, Ergocalciferol, (DRISDOL) 50000 UNITS CAPS Take 50,000 Units by mouth every 7 (seven) days. Take on saturdays    Historical  Provider, MD   Triage Vitals: BP 142/70 mmHg  Pulse 87  Temp(Src) 98.1 F (36.7 C) (Oral)  Resp 20  SpO2 98% Physical Exam  Constitutional: She is oriented to person, place, and time. She appears well-developed and well-nourished. No distress.  HENT:  Head: Normocephalic and atraumatic.  Nasal congestion, mucosal edema, postnasal drip.  Eyes: Conjunctivae and EOM are normal.  Neck: Normal range of motion. Neck supple.  Cardiovascular: Normal rate, regular rhythm and normal heart sounds.   Pulmonary/Chest: Effort normal and breath sounds normal. No respiratory distress. She has no wheezes.  Musculoskeletal: Normal range of motion. She exhibits no edema.  Lymphadenopathy:    She has no  cervical adenopathy.  Neurological: She is alert and oriented to person, place, and time. No sensory deficit.  Skin: Skin is warm and dry.  Psychiatric: She has a normal mood and affect. Her behavior is normal.  Nursing note and vitals reviewed.   ED Course  Procedures (including critical care time) DIAGNOSTIC STUDIES: Oxygen Saturation is 98% on ra, nl by my interpretation.    COORDINATION OF CARE: 2:54 PM-Discussed treatment plan which includes meds with pt at bedside and pt agreed to plan.   Labs Review Labs Reviewed - No data to display  Imaging Review No results found.   EKG Interpretation None      MDM   Final diagnoses:  Seasonal allergies  Nasal congestion  Post-nasal drip   NAD. VSS. Afebrile. O2 sat 98% on room air. Lungs clear. Asymptomatic in the ED, only symptomatic when outside near pollen. Albuterol inhaler given as she does not have one at this time. Advised her to continue daily over-the-counter allergy medications and use nasal saline along with Flonase. Follow-up with PCP within one week. Stable for discharge. Return precautions given. Patient states understanding of treatment care plan and is agreeable.  I personally performed the services described in this documentation, which was scribed in my presence. The recorded information has been reviewed and is accurate.   Kathrynn Speed, PA-C 05/06/14 1512  Jerelyn Scott, MD 05/06/14 [phone]

## 2014-11-12 ENCOUNTER — Encounter (HOSPITAL_COMMUNITY): Payer: Self-pay | Admitting: Emergency Medicine

## 2014-11-12 ENCOUNTER — Emergency Department (INDEPENDENT_AMBULATORY_CARE_PROVIDER_SITE_OTHER)
Admission: EM | Admit: 2014-11-12 | Discharge: 2014-11-12 | Disposition: A | Discharge status: Home / Self Care | Payer: No Typology Code available for payment source | Source: Home / Self Care | Attending: Emergency Medicine | Admitting: Emergency Medicine

## 2014-11-12 DIAGNOSIS — H1013 Acute atopic conjunctivitis, bilateral: Secondary | ICD-10-CM

## 2014-11-12 DIAGNOSIS — B354 Tinea corporis: Secondary | ICD-10-CM

## 2014-11-12 MED ORDER — OLOPATADINE HCL 0.2 % OP SOLN: OPHTHALMIC | Status: DC

## 2014-11-12 MED ORDER — TETRACAINE HCL 0.5 % OP SOLN
2.0000 [drp] | Freq: Once | OPHTHALMIC | Status: AC
  Administered 2014-11-12: 2 [drp] via OPHTHALMIC

## 2014-11-12 MED ORDER — TETRACAINE HCL 0.5 % OP SOLN
OPHTHALMIC | Status: AC
  Filled 2014-11-12: qty 2

## 2014-11-12 NOTE — ED Notes (Signed)
C/o left eye irritation onset 1 month; sx include redness Denies inj/trauma  Also c/o rash on left index finger onset 09/2014 A&O x4... No acute distress.

## 2014-11-12 NOTE — Discharge Instructions (Signed)
Allergic Conjunctivitis  The conjunctiva is a thin membrane that covers the visible white part of the eyeball and the underside of the eyelids. This membrane protects and lubricates the eye. The membrane has small blood vessels running through it that can normally be seen. When the conjunctiva becomes inflamed, the condition is called conjunctivitis. In response to the inflammation, the conjunctival blood vessels become swollen. The swelling results in redness in the normally white part of the eye.  The blood vessels of this membrane also react when a person has allergies and is then called allergic conjunctivitis. This condition usually lasts for as long as the allergy persists. Allergic conjunctivitis cannot be passed to another person (non-contagious). The likelihood of bacterial infection is great and the cause is not likely due to allergies if the inflamed eye has:  · A sticky discharge.  · Discharge or sticking together of the lids in the morning.  · Scaling or flaking of the eyelids where the eyelashes come out.  · Red swollen eyelids.  CAUSES   · Viruses.  · Irritants such as foreign bodies.  · Chemicals.  · General allergic reactions.  · Inflammation or serious diseases in the inside or the outside of the eye or the orbit (the boney cavity in which the eye sits) can cause a "red eye."  SYMPTOMS   · Eye redness.  · Tearing.  · Itchy eyes.  · Burning feeling in the eyes.  · Clear drainage from the eye.  · Allergic reaction due to pollens or ragweed sensitivity. Seasonal allergic conjunctivitis is frequent in the spring when pollens are in the air and in the fall.  DIAGNOSIS   This condition, in its many forms, is usually diagnosed based on the history and an ophthalmological exam. It usually involves both eyes. If your eyes react at the same time every year, allergies may be the cause. While most "red eyes" are due to allergy or an infection, the role of an eye (ophthalmological) exam is important. The exam  can rule out serious diseases of the eye or orbit.  TREATMENT   · Non-antibiotic eye drops, ointments, or medications by mouth may be prescribed if the ophthalmologist is sure the conjunctivitis is due to allergies alone.  · Over-the-counter drops and ointments for allergic symptoms should be used only after other causes of conjunctivitis have been ruled out, or as your caregiver suggests.  Medications by mouth are often prescribed if other allergy-related symptoms are present. If the ophthalmologist is sure that the conjunctivitis is due to allergies alone, treatment is normally limited to drops or ointments to reduce itching and burning.  HOME CARE INSTRUCTIONS   · Wash hands before and after applying drops or ointments, or touching the inflamed eye(s) or eyelids.  · Do not let the eye dropper tip or ointment tube touch the eyelid when putting medicine in your eye.  · Stop using your soft contact lenses and throw them away. Use a new pair of lenses when recovery is complete. You should run through sterilizing cycles at least three times before use after complete recovery if the old soft contact lenses are to be used. Hard contact lenses should be stopped. They need to be thoroughly sterilized before use after recovery.  · Itching and burning eyes due to allergies is often relieved by using a cool cloth applied to closed eye(s).  SEEK MEDICAL CARE IF:   · Your problems do not go away after two or three days of treatment.  ·   have extreme light sensitivity.  An oral temperature above 102 F (38.9 C) develops.  Pain in or around the eye or any other visual symptom develops. MAKE SURE YOU:   Understand these instructions.  Will watch your condition.  Will get help right away if you are not doing well or get worse. Document  Released: 04/19/2002 Document Revised: 04/21/2011 Document Reviewed: 03/15/2007 [facility] Patient Information 2015 [place], [place]. This information is not intended to replace advice given to you by your health care provider. Make sure you discuss any questions you have with your health care provider.  Body Ringworm Ringworm (tinea corporis) is a fungal infection of the skin on the body. This infection is not caused by worms, but is actually caused by a fungus. Fungus normally lives on the top of your skin and can be useful. However, in the case of ringworms, the fungus grows out of control and causes a skin infection. It can involve any area of skin on the body and can spread easily from one person to another (contagious). Ringworm is a common problem for children, but it can affect adults as well. Ringworm is also often found in athletes, especially wrestlers who share equipment and mats.  CAUSES  Ringworm of the body is caused by a fungus called dermatophyte. It can spread by:  Touchingother people who are infected.  Touchinginfected pets.  Touching or sharingobjects that have been in contact with the infected person or pet (hats, combs, towels, clothing, sports equipment). SYMPTOMS   Itchy, raised red spots and bumps on the skin.  Ring-shaped rash.  Redness near the border of the rash with a clear center.  Dry and scaly skin on or around the rash. Not every person develops a ring-shaped rash. Some develop only the red, scaly patches. DIAGNOSIS  Most often, ringworm can be diagnosed by performing a skin exam. Your caregiver may choose to take a skin scraping from the affected area. The sample will be examined under the microscope to see if the fungus is present.  TREATMENT  Body ringworm may be treated with a topical antifungal cream or ointment. Sometimes, an antifungal shampoo that can be used on your body is prescribed. You may be prescribed antifungal medicines to take by mouth if  your ringworm is severe, keeps coming back, or lasts a long time.  HOME CARE INSTRUCTIONS   Only take over-the-counter or prescription medicines as directed by your caregiver.  Wash the infected area and dry it completely before applying yourcream or ointment.  When using antifungal shampoo to treat the ringworm, leave the shampoo on the body for 3-5 minutes before rinsing.   Wear loose clothing to stop clothes from rubbing and irritating the rash.  Wash or change your bed sheets every night while you have the rash.  Have your pet treated by your veterinarian if it has the same infection. To prevent ringworm:   Practice good hygiene.  Wear sandals or shoes in public places and showers.  Do not share personal items with others.  Avoid touching red patches of skin on other people.  Avoid touching pets that have bald spots or wash your hands after doing so. SEEK MEDICAL CARE IF:   Your rash continues to spread after 7 days of treatment.  Your rash is not gone in 4 weeks.  The area around your rash becomes red, warm, tender, and swollen. Document Released: 01/25/2000 Document Revised: 10/22/2011 Document Reviewed: 08/11/2011 [facility] Patient Information 2015 [place], [place]. This information is not  intended to replace advice given to you by your health care provider. Make sure you discuss any questions you have with your health care provider. ° °

## 2014-11-13 NOTE — ED Provider Notes (Signed)
CSN: [id number]     Arrival date & time 11/12/14  1439 History   First MD Initiated Contact with Patient 11/12/14 1607     Chief Complaint  Patient presents with  . Eye Problem   (Consider location/radiation/quality/duration/timing/severity/associated sxs/prior Treatment) Patient is a 59 y.o. female presenting with eye problem. The history is provided by the patient. No language interpreter was used.  Eye Problem Location:  Both Quality:  Unable to specify Severity:  Moderate Onset quality:  Gradual Duration:  1 month Timing:  Constant Progression:  Worsening Chronicity:  New Relieved by:  Nothing Worsened by:  Nothing tried Ineffective treatments:  None tried Associated symptoms: redness     Past Medical History  Diagnosis Date  . Obesity   . Joint pain, knee     bilateral  . Chest pain   . Heart palpitations   . Asthma    Past Surgical History  Procedure Laterality Date  . Cesarean section  39    x2 with both children   Family History  Problem Relation Age of Onset  . Heart attack Mother     mid 61s  . Heart attack Father     104, died with first MI  . Diabetes Brother   . Hypertension Brother   . Hypertension Father   . Cancer Maternal Grandmother     cervical cancer   Social History  Substance Use Topics  . Smoking status: Former Smoker -- 2.00 packs/day for 2 years    Quit date: 05/05/1984  . Smokeless tobacco: None  . Alcohol Use: No   OB History    No data available     Review of Systems  Eyes: Positive for redness.  All other systems reviewed and are negative.   Allergies  Review of patient's allergies indicates no known allergies.  Home Medications   Prior to Admission medications   Medication Sig Start Date End Date Taking? Authorizing Provider  albuterol (PROVENTIL HFA;VENTOLIN HFA) 108 (90 BASE) MCG/ACT inhaler Inhale 2 puffs into the lungs every 4 (four) hours as needed for wheezing or shortness of breath. 06/13/13   Shon Baton, MD  aspirin EC 81 MG tablet Take 81 mg by mouth daily. 12/25/12   Kathlen Mody, MD  ibuprofen (ADVIL,MOTRIN) 200 MG tablet Take 200 mg by mouth every 6 (six) hours as needed for mild pain.    Historical Provider, MD  ipratropium (ATROVENT) 0.06 % nasal spray Place 2 sprays into both nostrils 4 (four) times daily. 02/13/14   Rodolph Bong, MD  meclizine (ANTIVERT) 12.5 MG tablet Take 1 tablet (12.5 mg total) by mouth 3 (three) times daily as needed. 02/13/14   Rodolph Bong, MD  Olopatadine HCl (PATADAY) 0.2 % SOLN Use twice a day for eye irritation 11/12/14   Elson Areas, PA-C  predniSONE (DELTASONE) 20 MG tablet Take 3 tablets (60 mg total) by mouth daily with breakfast. 06/13/13   Shon Baton, MD  Vitamin D, Ergocalciferol, (DRISDOL) 50000 UNITS CAPS Take 50,000 Units by mouth every 7 (seven) days. Take on saturdays    Historical Provider, MD   Meds Ordered and Administered this Visit   Medications  tetracaine (PONTOCAINE) 0.5 % ophthalmic solution 2 drop (2 drops Both Eyes Given 11/12/14 1724)    BP 152/76 mmHg  Pulse 72  Temp(Src) 98.1 F (36.7 C) (Oral)  Resp 16  SpO2 97% No data found.   Physical Exam  Constitutional: She is oriented to person, place, and  time. She appears well-developed and well-nourished.  HENT:  Head: Normocephalic and atraumatic.  Eyes: EOM are normal. Pupils are equal, round, and reactive to light.  Injected conjunctiva bilat  Neck: Normal range of motion.  Cardiovascular: Normal rate.   Pulmonary/Chest: Effort normal.  Abdominal: She exhibits no distension.  Musculoskeletal: Normal range of motion.  Neurological: She is alert and oriented to person, place, and time.  Psychiatric: She has a normal mood and affect.  Nursing note and vitals reviewed.   ED Course  Procedures (including critical care time)  Labs Review Labs Reviewed - No data to display  Imaging Review No results found.   Visual Acuity Review  Right Eye  Distance: 20/40 Left Eye Distance: 20/70 Bilateral Distance: 20/40 (pt wears glasses but did not have them with her)  Right Eye Near:   Left Eye Near:    Bilateral Near:     Tonopen pressures Right 19  Left 15.     MDM   1. Allergic conjunctivitis, bilateral   2. Ringworm, body    Pt advised to continue lotrim cream on finger. I suspect eye irritation is allergic.   Pt given rx for pataday.   I advised she may try otc allergy medications first.    Elson Areas, PA-C 11/13/14 2150

## 2015-05-16 DIAGNOSIS — M1990 Unspecified osteoarthritis, unspecified site: Secondary | ICD-10-CM | POA: Insufficient documentation

## 2015-08-01 DIAGNOSIS — M810 Age-related osteoporosis without current pathological fracture: Secondary | ICD-10-CM | POA: Insufficient documentation

## 2015-08-21 DIAGNOSIS — G894 Chronic pain syndrome: Secondary | ICD-10-CM | POA: Insufficient documentation

## 2015-09-09 ENCOUNTER — Encounter (HOSPITAL_COMMUNITY): Payer: Self-pay | Admitting: *Deleted

## 2015-09-09 ENCOUNTER — Ambulatory Visit (HOSPITAL_COMMUNITY)
Admission: EM | Admit: 2015-09-09 | Discharge: 2015-09-09 | Disposition: A | Discharge status: Home / Self Care | Payer: BLUE CROSS/BLUE SHIELD | Attending: Emergency Medicine | Admitting: Emergency Medicine

## 2015-09-09 DIAGNOSIS — M25551 Pain in right hip: Secondary | ICD-10-CM

## 2015-09-09 MED ORDER — CYCLOBENZAPRINE HCL 5 MG PO TABS: 5.0000 mg | ORAL_TABLET | Freq: Every day | ORAL | 0 refills | Status: DC

## 2015-09-09 MED ORDER — DICLOFENAC POTASSIUM 50 MG PO TABS: 50.0000 mg | ORAL_TABLET | Freq: Three times a day (TID) | ORAL | 0 refills | Status: DC

## 2015-09-09 NOTE — ED Provider Notes (Signed)
[facility]    CSN: [id number] Arrival date & time: 09/09/15  8421  First Provider Contact:  First MD Initiated Contact with Patient 09/09/15 2005        History   Chief Complaint Chief Complaint  Patient presents with  . Back Pain  . Leg Pain    HPI Renee Kerr is a 60 y.o. female.   She is a 60 year old woman here for evaluation of right back and hip pain. This started about a week ago and was initially intermittent, but has become constant over the last 2-3 days. Pain is located in the right buttock and wraps around into the groin and anterior thigh. Pain is worse with sitting up and laying down. It gets worse the more she walks on it. She denies any injury or trauma. She reports a history of rib pain as well but nobody can figure out. She states when she was a child she had rickets and had to be on high-dose vitamin D for several years. She does continue to take over-the-counter vitamin D. She states her PCP did check a level and it was normal in the last month. She has tried Mobic and Advil without improvement.  Her PCP gave her tramadol, but this made her very loopy.      Past Medical History:  Diagnosis Date  . Asthma   . Chest pain   . Heart palpitations   . Joint pain, knee    bilateral  . Obesity     Patient Active Problem List   Diagnosis Date Noted  . Chest pain, atypical 12/23/2012  . Anxiety 12/23/2012  . ALLERGIC RHINITIS 06/02/2008  . ASTHMA 06/02/2008  . LOW BACK PAIN 06/02/2008  . COLONIC POLYPS, HX OF 06/02/2008    Past Surgical History:  Procedure Laterality Date  . CESAREAN SECTION  1986   x2 with both children    OB History    No data available       Home Medications    Prior to Admission medications   Medication Sig Start Date End Date Taking? Authorizing Provider  ibuprofen (ADVIL,MOTRIN) 200 MG tablet Take 200 mg by mouth every 6 (six) hours as needed for mild pain.   Yes Historical Provider, MD  meloxicam  (MOBIC) 15 MG tablet Take 15 mg by mouth daily.   Yes Historical Provider, MD  Vitamin D, Ergocalciferol, (DRISDOL) 50000 UNITS CAPS Take 50,000 Units by mouth every 7 (seven) days. Take on saturdays   Yes Historical Provider, MD  albuterol (PROVENTIL HFA;VENTOLIN HFA) 108 (90 BASE) MCG/ACT inhaler Inhale 2 puffs into the lungs every 4 (four) hours as needed for wheezing or shortness of breath. 06/13/13   Shon Baton, MD  aspirin EC 81 MG tablet Take 81 mg by mouth daily. 12/25/12   Kathlen Mody, MD  cyclobenzaprine (FLEXERIL) 5 MG tablet Take 1 tablet (5 mg total) by mouth at bedtime. 09/09/15   Charm Rings, MD  diclofenac (CATAFLAM) 50 MG tablet Take 1 tablet (50 mg total) by mouth 3 (three) times daily. 09/09/15   Charm Rings, MD  ipratropium (ATROVENT) 0.06 % nasal spray Place 2 sprays into both nostrils 4 (four) times daily. 02/13/14   Rodolph Bong, MD  meclizine (ANTIVERT) 12.5 MG tablet Take 1 tablet (12.5 mg total) by mouth 3 (three) times daily as needed. 02/13/14   Rodolph Bong, MD  Olopatadine HCl (PATADAY) 0.2 % SOLN Use twice a day for eye irritation 11/12/14  Elson Areas, PA-C    Family History Family History  Problem Relation Age of Onset  . Heart attack Mother     mid 7s  . Heart attack Father     76, died with first MI  . Hypertension Father   . Diabetes Brother   . Hypertension Brother   . Cancer Maternal Grandmother     cervical cancer    Social History Social History  Substance Use Topics  . Smoking status: Former Smoker    Packs/day: 2.00    Years: 2.00    Quit date: 05/05/1984  . Smokeless tobacco: Never Used  . Alcohol use No     Allergies   Review of patient's allergies indicates no known allergies.   Review of Systems Review of Systems  Musculoskeletal: Positive for arthralgias and back pain.     Physical Exam Triage Vital Signs ED Triage Vitals  Enc Vitals Group     BP 09/09/15 2022 112/74     Pulse Rate 09/09/15 2022 79     Resp  09/09/15 2022 16     Temp 09/09/15 2022 98.9 F (37.2 C)     Temp Source 09/09/15 2022 Oral     SpO2 09/09/15 2022 100 %     Weight --      Height --      Head Circumference --      Peak Flow --      Pain Score 09/09/15 2023 9     Pain Loc --      Pain Edu? --      Excl. in GC? --    No data found.   Updated Vital Signs BP 112/74 (BP Location: Right Arm)   Pulse 79   Temp 98.9 F (37.2 C) (Oral)   Resp 16   SpO2 100%   Visual Acuity Right Eye Distance:   Left Eye Distance:   Bilateral Distance:    Right Eye Near:   Left Eye Near:    Bilateral Near:     Physical Exam  Constitutional: She is oriented to person, place, and time. She appears well-developed and well-nourished. No distress.  Morbidly obese  Cardiovascular: Normal rate.   Pulmonary/Chest: Effort normal.  Musculoskeletal:  Back: No erythema or edema. No vertebral tenderness or step-offs. She does have some tenderness to palpation in the superior lateral buttock. Right hip: She is tender to palpation over the anterior groin. No greater trochanter tenderness. She does have pain with passive internal rotation of the hip.  Neurological: She is alert and oriented to person, place, and time.     UC Treatments / Results  Labs (all labs ordered are listed, but only abnormal results are displayed) Labs Reviewed - No data to display  EKG  EKG Interpretation None       Radiology No results found.  Procedures Procedures (including critical care time)  Medications Ordered in UC Medications - No data to display   Initial Impression / Assessment and Plan / UC Course  I have reviewed the triage vital signs and the nursing notes.  Pertinent labs & imaging results that were available during my care of the patient were reviewed by me and considered in my medical decision making (see chart for details).  Clinical Course    I suspect that her pain is coming from her hip rather than her  back. Symptomatic treatment with diclofenac and Flexeril. I would like her to follow-up with an orthopedic doctor for additional evaluation. I'm  concerned for possible stress fracture versus avascular necrosis versus some other etiology not particularly with her history of rickets as a child.  Final Clinical Impressions(s) / UC Diagnoses   Final diagnoses:  Right hip pain    New Prescriptions Discharge Medication List as of 09/09/2015  9:11 PM    START taking these medications   Details  cyclobenzaprine (FLEXERIL) 5 MG tablet Take 1 tablet (5 mg total) by mouth at bedtime., Starting Sun 09/09/2015, Print    diclofenac (CATAFLAM) 50 MG tablet Take 1 tablet (50 mg total) by mouth 3 (three) times daily., Starting Sun 09/09/2015, Print         Charm Rings, MD 09/09/15 2142

## 2015-09-09 NOTE — ED Triage Notes (Signed)
Patient with right sided back pain radiating down into right leg and right groin, denies injury. States she has been worked up for same and has not been given any dx for the same. Has been taking mobic and advil with no relief.

## 2015-09-09 NOTE — Discharge Instructions (Signed)
I am concerned that something is going on in your right hip that is causing your pain. Please make an appointment to see Dr. Luiz Blare, an orthopedic specialist, for additional evaluation. In the meantime, take diclofenac 3 times a day. This is to help with pain. Do not take ibuprofen, Advil, or Aleve with this medicine. Take Flexeril 1 tablet at bedtime to help with muscle spasms. This medicine will make you drowsy.

## 2015-10-26 DIAGNOSIS — R7303 Prediabetes: Secondary | ICD-10-CM | POA: Insufficient documentation

## 2015-11-12 DIAGNOSIS — N951 Menopausal and female climacteric states: Secondary | ICD-10-CM | POA: Insufficient documentation

## 2016-06-25 DIAGNOSIS — J302 Other seasonal allergic rhinitis: Secondary | ICD-10-CM | POA: Insufficient documentation

## 2018-12-04 ENCOUNTER — Other Ambulatory Visit: Payer: Self-pay

## 2018-12-04 DIAGNOSIS — Z20822 Contact with and (suspected) exposure to covid-19: Secondary | ICD-10-CM

## 2018-12-05 LAB — NOVEL CORONAVIRUS, NAA: SARS-CoV-2, NAA: NOT DETECTED

## 2018-12-20 DIAGNOSIS — M79671 Pain in right foot: Secondary | ICD-10-CM | POA: Insufficient documentation

## 2020-05-31 ENCOUNTER — Other Ambulatory Visit: Payer: Self-pay | Admitting: Endocrinology

## 2020-05-31 DIAGNOSIS — Z1231 Encounter for screening mammogram for malignant neoplasm of breast: Secondary | ICD-10-CM

## 2020-07-19 ENCOUNTER — Ambulatory Visit: Payer: BLUE CROSS/BLUE SHIELD

## 2021-04-10 ENCOUNTER — Other Ambulatory Visit: Payer: Self-pay

## 2021-04-10 DIAGNOSIS — I872 Venous insufficiency (chronic) (peripheral): Secondary | ICD-10-CM

## 2021-04-10 DIAGNOSIS — L309 Dermatitis, unspecified: Secondary | ICD-10-CM | POA: Insufficient documentation

## 2021-04-16 ENCOUNTER — Encounter (HOSPITAL_COMMUNITY): Payer: Self-pay

## 2021-04-16 ENCOUNTER — Other Ambulatory Visit: Payer: Self-pay

## 2021-04-16 ENCOUNTER — Emergency Department (HOSPITAL_COMMUNITY)
Admission: EM | Admit: 2021-04-16 | Discharge: 2021-04-16 | Disposition: A | Discharge status: Home / Self Care | Payer: Medicare Other | Attending: Emergency Medicine | Admitting: Emergency Medicine

## 2021-04-16 DIAGNOSIS — Z7982 Long term (current) use of aspirin: Secondary | ICD-10-CM | POA: Diagnosis not present

## 2021-04-16 DIAGNOSIS — M79605 Pain in left leg: Secondary | ICD-10-CM

## 2021-04-16 DIAGNOSIS — I82412 Acute embolism and thrombosis of left femoral vein: Secondary | ICD-10-CM | POA: Diagnosis not present

## 2021-04-16 DIAGNOSIS — Z6841 Body Mass Index (BMI) 40.0 and over, adult: Secondary | ICD-10-CM | POA: Diagnosis not present

## 2021-04-16 DIAGNOSIS — R030 Elevated blood-pressure reading, without diagnosis of hypertension: Secondary | ICD-10-CM | POA: Diagnosis not present

## 2021-04-16 MED ORDER — HYDROCODONE-ACETAMINOPHEN 5-325 MG PO TABS
1.0000 | ORAL_TABLET | Freq: Once | ORAL | Status: AC
  Administered 2021-04-16: 1 via ORAL
  Filled 2021-04-16: qty 1

## 2021-04-16 MED ORDER — TIZANIDINE HCL 4 MG PO CAPS: 4.0000 mg | ORAL_CAPSULE | Freq: Every evening | ORAL | 0 refills | Status: DC | PRN

## 2021-04-16 MED ORDER — ACETAMINOPHEN 500 MG PO TABS: 500.0000 mg | ORAL_TABLET | Freq: Four times a day (QID) | ORAL | 0 refills | Status: AC | PRN

## 2021-04-16 NOTE — ED Provider Notes (Signed)
?[facility]-EMERGENCY DEPT ?Provider Note ? ? ?CSN: [id number] ?Arrival date & time: 04/16/21  1853 ? ?  ? ?History ? ?Chief Complaint  ?Patient presents with  ? Leg Pain  ? ? ?Renee Kerr is a 66 y.o. female. ? ?66 year old female with complaint of pain in her left medial knee onset today while walking at the gym. States she was on lap 8 when her knee buckled, has had pain in her medial left knee since that time. Patient does have varicose veins and is planning to see a vascular surgeon. Went to UC this evening and was sent to the ER for further work up. Denies back pain, leg weakness or numbness. Did not fall with the event today. No history of DVT.  ? ? ?  ? ?Home Medications ?Prior to Admission medications   ?Medication Sig Start Date End Date Taking? Authorizing Provider  ?acetaminophen (TYLENOL) 500 MG tablet Take 1 tablet (500 mg total) by mouth every 6 (six) hours as needed. 04/16/21  Yes Derwood Kaplan, MD  ?tiZANidine (ZANAFLEX) 4 MG capsule Take 1 capsule (4 mg total) by mouth at bedtime as needed for muscle spasms. 04/16/21  Yes Derwood Kaplan, MD  ?albuterol (PROVENTIL HFA;VENTOLIN HFA) 108 (90 BASE) MCG/ACT inhaler Inhale 2 puffs into the lungs every 4 (four) hours as needed for wheezing or shortness of breath. 06/13/13   Horton, Mayer Masker, MD  ?aspirin EC 81 MG tablet Take 81 mg by mouth daily. 12/25/12   Kathlen Mody, MD  ?diclofenac (CATAFLAM) 50 MG tablet Take 1 tablet (50 mg total) by mouth 3 (three) times daily. 09/09/15   Charm Rings, MD  ?ibuprofen (ADVIL,MOTRIN) 200 MG tablet Take 200 mg by mouth every 6 (six) hours as needed for mild pain.    [provider]  ?ipratropium (ATROVENT) 0.06 % nasal spray Place 2 sprays into both nostrils 4 (four) times daily. 02/13/14   Rodolph Bong, MD  ?meclizine (ANTIVERT) 12.5 MG tablet Take 1 tablet (12.5 mg total) by mouth 3 (three) times daily as needed. 02/13/14   Rodolph Bong, MD  ?meloxicam (MOBIC) 15 MG tablet Take 15 mg  by mouth daily.    [provider]  ?Olopatadine HCl (PATADAY) 0.2 % SOLN Use twice a day for eye irritation 11/12/14   Elson Areas, PA-C  ?Vitamin D, Ergocalciferol, (DRISDOL) 50000 UNITS CAPS Take 50,000 Units by mouth every 7 (seven) days. Take on saturdays    [provider]  ?   ? ?Allergies    ?Patient has no known allergies.   ? ?Review of Systems   ?Review of Systems ?Negative except as per HPI ?Physical Exam ?Updated Vital Signs ?BP (!) 159/70   Pulse 63   Temp 98.2 ?F (36.8 ?C) (Oral)   Resp 16   Ht 4\' 11"  (1.499 m)   Wt 96.2 kg   SpO2 97%   BMI 42.82 kg/m?  ?Physical Exam ?Vitals and nursing note reviewed.  ?Constitutional:   ?   General: She is not in acute distress. ?   Appearance: She is well-developed. She is not diaphoretic.  ?HENT:  ?   Head: Normocephalic and atraumatic.  ?Cardiovascular:  ?   Pulses: Normal pulses.  ?Pulmonary:  ?   Effort: Pulmonary effort is normal.  ?Musculoskeletal:     ?   General: No tenderness.  ?   Left knee: No effusion, erythema, ecchymosis, bony tenderness or crepitus. Normal range of motion. No tenderness. No  LCL laxity or MCL laxity.Normal pulse.  ?   Comments: Varicose veins to legs. No calf tenderness, no palpable cords.   ?Skin: ?   General: Skin is warm and dry.  ?   Findings: No bruising, erythema or rash.  ?Neurological:  ?   Mental Status: She is alert and oriented to person, place, and time.  ?   Sensory: No sensory deficit.  ?   Motor: No weakness.  ?Psychiatric:     ?   Behavior: Behavior normal.  ? ? ?ED Results / Procedures / Treatments   ?Labs ?(all labs ordered are listed, but only abnormal results are displayed) ?Labs Reviewed - No data to display ? ?EKG ?None ? ?Radiology ?No results found. ? ?Procedures ?Procedures  ? ? ?Medications Ordered in ED ?Medications  ?HYDROcodone-acetaminophen (NORCO/VICODIN) 5-325 MG per tablet 1 tablet (1 tablet Oral Given 04/16/21 2021)  ? ? ?ED Course/ Medical Decision Making/ A&P ?  ?                         ?Medical Decision Making ?Amount and/or Complexity of Data Reviewed ?ECG/medicine tests: ordered. ? ? ?66 year old female with complaint of left knee pain as above.,  Denies pain prior to her knee giving out at her tonight while walking at the gym.  Did not fall in this event. Went to urgent care and was sent to the ER for DVT rule out.  History and exam not consistent with DVT.  Discussed with Dr. Rhunette Croft, ER attending who has examined the patient.  Suspect possible Baker's cyst rupture, plan is for patient to return to the ED ER tomorrow for DVT study. ? ? ? ? ? ? ? ?Final Clinical Impression(s) / ED Diagnoses ?Final diagnoses:  ?Left leg pain  ? ? ?Rx / DC Orders ?ED Discharge Orders   ? ?      Ordered  ?  LE VENOUS       ? 04/16/21 1935  ?  acetaminophen (TYLENOL) 500 MG tablet  Every 6 hours PRN       ? 04/16/21 1950  ?  tiZANidine (ZANAFLEX) 4 MG capsule  At bedtime PRN       ? 04/16/21 1950  ? ?  ?  ? ?  ? ? ?  ?Jeannie Fend, PA-C ?04/16/21 2308 ? ?  ?Derwood Kaplan, MD ?04/17/21 1625 ? ?

## 2021-04-16 NOTE — Discharge Instructions (Addendum)
IMPORTANT PATIENT INSTRUCTIONS:  You have been scheduled for an Outpatient Vascular Study at [facility].   ? ?If tomorrow is a Saturday, Sunday or holiday, please go to the [facility] Emergency Department Registration Desk at 11 am tomorrow morning and tell them you are there for a vascular study.   ?If tomorrow is a weekday (Monday-Friday), please go to [facility] En[facility] Registration at 11 am and tell them you are there for a vascular study. ?

## 2021-04-16 NOTE — ED Triage Notes (Signed)
[place] [employer] [place] and was on her 8th lap when her left knee down to the ankle began hurting. ?

## 2021-04-17 ENCOUNTER — Ambulatory Visit (HOSPITAL_COMMUNITY)
Admission: RE | Admit: 2021-04-17 | Discharge: 2021-04-17 | Disposition: A | Discharge status: Home / Self Care | Payer: Medicare Other | Source: Ambulatory Visit | Attending: Emergency Medicine | Admitting: Emergency Medicine

## 2021-04-17 DIAGNOSIS — M25562 Pain in left knee: Secondary | ICD-10-CM | POA: Diagnosis not present

## 2021-04-19 DIAGNOSIS — I83899 Varicose veins of unspecified lower extremities with other complications: Secondary | ICD-10-CM | POA: Diagnosis not present

## 2021-04-19 DIAGNOSIS — Z79899 Other long term (current) drug therapy: Secondary | ICD-10-CM | POA: Diagnosis not present

## 2021-04-19 DIAGNOSIS — E559 Vitamin D deficiency, unspecified: Secondary | ICD-10-CM | POA: Diagnosis not present

## 2021-04-19 DIAGNOSIS — I82412 Acute embolism and thrombosis of left femoral vein: Secondary | ICD-10-CM | POA: Diagnosis not present

## 2021-04-19 DIAGNOSIS — I83819 Varicose veins of unspecified lower extremities with pain: Secondary | ICD-10-CM | POA: Diagnosis not present

## 2021-04-22 ENCOUNTER — Other Ambulatory Visit: Payer: Self-pay

## 2021-04-22 ENCOUNTER — Ambulatory Visit (HOSPITAL_COMMUNITY)
Admission: RE | Admit: 2021-04-22 | Discharge: 2021-04-22 | Disposition: A | Discharge status: Home / Self Care | Payer: Medicare Other | Source: Ambulatory Visit | Attending: Surgery | Admitting: Surgery

## 2021-04-22 DIAGNOSIS — I872 Venous insufficiency (chronic) (peripheral): Secondary | ICD-10-CM | POA: Insufficient documentation

## 2021-04-22 NOTE — Progress Notes (Unsigned)
[facility] [place]  ASSESSMENT / PLAN: 66 y.o. female with *** - ***  CHIEF COMPLAINT: ***  HISTORY OF PRESENT ILLNESS: Renee Kerr is a 66 y.o. female ***  VASCULAR SURGICAL HISTORY: ***  VASCULAR RISK FACTORS: {FINDINGS; POSITIVE [phone]} history of stroke / transient ischemic attack. {FINDINGS; POSITIVE [phone]} history of coronary artery disease. *** history of PCI. *** history of CABG.  {FINDINGS; POSITIVE [phone]} history of diabetes mellitus. Last A1c ***. {FINDINGS; POSITIVE [phone]} history of smoking. *** actively smoking. {FINDINGS; POSITIVE [phone]} history of hypertension. *** drug regimen with *** control. {FINDINGS; POSITIVE [phone]} history of chronic kidney disease.  Last GFR ***. CKD {stage:30421363}. {FINDINGS; POSITIVE [phone]} history of chronic obstructive pulmonary disease, treated with ***.  FUNCTIONAL STATUS: ECOG performance status: {findings; ecog performance status:31780} Ambulatory status: {TNHAmbulation:25868}  Past Medical History:  Diagnosis Date   Asthma    Chest pain    Heart palpitations    Joint pain, knee    bilateral   Obesity     Past Surgical History:  Procedure Laterality Date   CESAREAN SECTION  70   x2 with both children    Family History  Problem Relation Age of Onset   Heart attack Mother        mid 68s   Heart attack Father        53, died with first MI   Hypertension Father    Diabetes Brother    Hypertension Brother    Cancer Maternal Grandmother        cervical cancer    Social History   Socioeconomic History   Marital status: Married    Spouse name: Aalaysia Liggins   Number of children: 2   Years of education: Not on file   Highest education level: Not on file  Occupational History   Occupation: Presenter, broadcasting: [employer]  Tobacco Use   Smoking status: Former    Packs/day:  2.00    Years: 2.00    Pack years: 4.00    Types: Cigarettes    Quit date: 05/05/1984    Years since quitting: 36.9   Smokeless tobacco: Never  Vaping Use   Vaping Use: Never used  Substance and Sexual Activity   Alcohol use: No   Drug use: Never   Sexual activity: Not on file  Other Topics Concern   Not on file  Social History Narrative   Lives in [place] with husband. Has two sons one lives in [place] and one lives in [place]. 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   Social Determinants of Health   Financial Resource Strain: Not on file  Food Insecurity: Not on file  Transportation Needs: Not on file  Physical Activity: Not on file  Stress: Not on file  Social Connections: Not on file  Intimate Partner Violence: Not on file    No Known Allergies  Current Outpatient Medications  Medication Sig Dispense Refill   acetaminophen (TYLENOL) 500 MG tablet Take 1 tablet (500 mg total) by mouth every 6 (six) hours as needed. 30 tablet 0   albuterol (PROVENTIL HFA;VENTOLIN HFA) 108 (90 BASE) MCG/ACT inhaler Inhale 2 puffs into the lungs every 4 (four) hours as needed for wheezing or shortness of breath. 1 Inhaler 0   aspirin EC 81 MG tablet Take 81 mg by mouth daily.     diclofenac (CATAFLAM) 50 MG tablet Take 1 tablet (50 mg total) by mouth 3 (three) times daily.  60 tablet 0   ibuprofen (ADVIL,MOTRIN) 200 MG tablet Take 200 mg by mouth every 6 (six) hours as needed for mild pain.     ipratropium (ATROVENT) 0.06 % nasal spray Place 2 sprays into both nostrils 4 (four) times daily. 15 mL 1   meclizine (ANTIVERT) 12.5 MG tablet Take 1 tablet (12.5 mg total) by mouth 3 (three) times daily as needed. 30 tablet 0   meloxicam (MOBIC) 15 MG tablet Take 15 mg by mouth daily.     Olopatadine HCl (PATADAY) 0.2 % SOLN Use twice a day for eye irritation 2.5 mL 0   tiZANidine (ZANAFLEX) 4 MG capsule Take 1 capsule (4 mg total) by mouth at bedtime as needed for muscle spasms. 10  capsule 0   Vitamin D, Ergocalciferol, (DRISDOL) 50000 UNITS CAPS Take 50,000 Units by mouth every 7 (seven) days. Take on saturdays     No current facility-administered medications for this visit.    PHYSICAL EXAM There were no vitals filed for this visit.  Constitutional: *** appearing. *** distress. Appears *** nourished.  Neurologic: CN ***. *** focal findings. *** sensory loss. Psychiatric: *** Mood and affect symmetric and appropriate. Eyes: *** No icterus. No conjunctival pallor. Ears, nose, throat: *** mucous membranes moist. Midline trachea.  Cardiac: *** rate and rhythm.  Respiratory: *** unlabored. Abdominal: *** soft, non-tender, non-distended.  Peripheral vascular: *** Extremity: *** edema. *** cyanosis. *** pallor.  Skin: *** gangrene. *** ulceration.  Lymphatic: *** Stemmer's sign. *** palpable lymphadenopathy.  PERTINENT LABORATORY AND RADIOLOGIC DATA  Most recent CBC CBC Latest Ref Rng & Units 06/13/2013 12/23/2012 06/03/2012  WBC 4.0 - 10.5 K/uL 8.6 10.7(H) 11.0(H)  Hemoglobin 12.0 - 15.0 g/dL 16.112.7 09.612.6 04.513.6  Hematocrit 36.0 - 46.0 % 39.3 37.8 40.3  Platelets 150 - 400 K/uL 205 208 245     Most recent CMP CMP Latest Ref Rng & Units 06/13/2013 12/23/2012 06/03/2012  Glucose 70 - 99 mg/dL 87 409(W101(H) 84  BUN 6 - 23 mg/dL 13 14 16   Creatinine 0.50 - 1.10 mg/dL 1.190.84 1.470.92 8.291.07  Sodium 137 - 147 mEq/L 144 140 143  Potassium 3.7 - 5.3 mEq/L 4.3 4.0 3.9  Chloride 96 - 112 mEq/L 105 104 105  CO2 19 - 32 mEq/L 27 27 29   Calcium 8.4 - 10.5 mg/dL 8.7 8.9 9.4  Total Protein 6.0 - 8.3 g/dL - - 7.8  Total Bilirubin 0.3 - 1.2 mg/dL - - 5.6(O0.2(L)  Alkaline Phos 39 - 117 U/L - - 69  AST 0 - 37 U/L - - 18  ALT 0 - 35 U/L - - 14    Renal function CrCl cannot be calculated (Patient's most recent lab result is older than the maximum 21 days allowed.).  Hgb A1c MFr Bld (%)  Date Value  12/24/2012 6.1 (H)    LDL Cholesterol  Date Value Ref Range Status  12/24/2012 31 0 - 99  mg/dL Final    Comment:           Total Cholesterol/HDL:CHD Risk Coronary Heart Disease Risk Table                     Men   Women  1/2 Average Risk   3.4   3.3  Average Risk       5.0   4.4  2 X Average Risk   9.6   7.1  3 X Average Risk  23.4   11.0  Use the calculated Patient Ratio above and the CHD Risk Table to determine the patient's CHD Risk.        ATP III CLASSIFICATION (LDL):  <100     mg/dL   Optimal  528-413  mg/dL   Near or Above                    Optimal  130-159  mg/dL   Borderline  244-010  mg/dL   High  >272     mg/dL   Very High Performed at [facility]    Venous reflux study Left:  - No evidence of deep vein thrombosis from the common femoral through the  popliteal veins.  - No evidence of superficial venous thrombosis.  - No evidence of Baker's Cyst.  - The deep venous system is competent.  - The great and small saphenous veins are competent.   Rande Brunt. Lenell Antu, MD [facility] Phone Number: [phone] 04/22/2021 8:59 PM  Total time spent on preparing this encounter including chart review, data review, collecting history, examining the patient, coordinating care for this {tnhtimebilling:26202}  Portions of this report may have been transcribed using voice recognition software.  Every effort has been made to ensure accuracy; however, inadvertent computerized transcription errors may still be present.

## 2021-04-23 ENCOUNTER — Encounter: Payer: Self-pay | Admitting: Vascular Surgery

## 2021-04-23 ENCOUNTER — Ambulatory Visit: Payer: Medicare Other | Admitting: Vascular Surgery

## 2021-04-23 VITALS — BP 128/74 | HR 62 | Temp 98.7°F | Resp 20 | Ht 59.0 in | Wt 214.0 lb

## 2021-04-23 DIAGNOSIS — I872 Venous insufficiency (chronic) (peripheral): Secondary | ICD-10-CM

## 2021-04-26 DIAGNOSIS — M79662 Pain in left lower leg: Secondary | ICD-10-CM | POA: Diagnosis not present

## 2021-05-13 ENCOUNTER — Ambulatory Visit: Payer: Medicare Other | Admitting: Family Medicine

## 2021-05-20 DIAGNOSIS — H526 Other disorders of refraction: Secondary | ICD-10-CM | POA: Diagnosis not present

## 2021-05-30 ENCOUNTER — Ambulatory Visit (INDEPENDENT_AMBULATORY_CARE_PROVIDER_SITE_OTHER): Payer: Medicare Other | Admitting: Family Medicine

## 2021-05-30 ENCOUNTER — Encounter: Payer: Self-pay | Admitting: Family Medicine

## 2021-05-30 VITALS — BP 124/72 | HR 68 | Temp 98.6°F | Ht 59.0 in | Wt 210.2 lb

## 2021-05-30 DIAGNOSIS — E559 Vitamin D deficiency, unspecified: Secondary | ICD-10-CM

## 2021-05-30 DIAGNOSIS — Z1331 Encounter for screening for depression: Secondary | ICD-10-CM | POA: Insufficient documentation

## 2021-05-30 DIAGNOSIS — N898 Other specified noninflammatory disorders of vagina: Secondary | ICD-10-CM | POA: Diagnosis not present

## 2021-05-30 DIAGNOSIS — R140 Abdominal distension (gaseous): Secondary | ICD-10-CM | POA: Insufficient documentation

## 2021-05-30 DIAGNOSIS — Z1159 Encounter for screening for other viral diseases: Secondary | ICD-10-CM | POA: Insufficient documentation

## 2021-05-30 DIAGNOSIS — K5901 Slow transit constipation: Secondary | ICD-10-CM

## 2021-05-30 DIAGNOSIS — Z136 Encounter for screening for cardiovascular disorders: Secondary | ICD-10-CM | POA: Diagnosis not present

## 2021-05-30 DIAGNOSIS — N951 Menopausal and female climacteric states: Secondary | ICD-10-CM

## 2021-05-30 DIAGNOSIS — Z0000 Encounter for general adult medical examination without abnormal findings: Secondary | ICD-10-CM | POA: Diagnosis not present

## 2021-05-30 DIAGNOSIS — R198 Other specified symptoms and signs involving the digestive system and abdomen: Secondary | ICD-10-CM

## 2021-05-30 DIAGNOSIS — Z1211 Encounter for screening for malignant neoplasm of colon: Secondary | ICD-10-CM

## 2021-05-30 DIAGNOSIS — R7303 Prediabetes: Secondary | ICD-10-CM | POA: Diagnosis not present

## 2021-05-30 LAB — URINALYSIS
Bilirubin Urine: NEGATIVE
Hgb urine dipstick: NEGATIVE
Ketones, ur: NEGATIVE
Leukocytes,Ua: NEGATIVE
Nitrite: NEGATIVE
Specific Gravity, Urine: 1.01 (ref 1.000–1.030)
Total Protein, Urine: NEGATIVE
Urine Glucose: NEGATIVE
Urobilinogen, UA: 0.2 (ref 0.0–1.0)
pH: 7 (ref 5.0–8.0)

## 2021-05-30 LAB — CBC WITH DIFFERENTIAL/PLATELET
Basophils Absolute: 0 10*3/uL (ref 0.0–0.1)
Basophils Relative: 0.6 % (ref 0.0–3.0)
Eosinophils Absolute: 0.1 10*3/uL (ref 0.0–0.7)
Eosinophils Relative: 2.1 % (ref 0.0–5.0)
HCT: 39.9 % (ref 36.0–46.0)
Hemoglobin: 12.6 g/dL (ref 12.0–15.0)
Lymphocytes Relative: 38.2 % (ref 12.0–46.0)
Lymphs Abs: 2.3 10*3/uL (ref 0.7–4.0)
MCHC: 31.6 g/dL (ref 30.0–36.0)
MCV: 81.4 fl (ref 78.0–100.0)
Monocytes Absolute: 0.5 10*3/uL (ref 0.1–1.0)
Monocytes Relative: 8.3 % (ref 3.0–12.0)
Neutro Abs: 3.1 10*3/uL (ref 1.4–7.7)
Neutrophils Relative %: 50.8 % (ref 43.0–77.0)
Platelets: 222 10*3/uL (ref 150.0–400.0)
RBC: 4.9 Mil/uL (ref 3.87–5.11)
RDW: 14.7 % (ref 11.5–15.5)
WBC: 6.1 10*3/uL (ref 4.0–10.5)

## 2021-05-30 LAB — COMPREHENSIVE METABOLIC PANEL
ALT: 11 U/L (ref 0–35)
AST: 12 U/L (ref 0–37)
Albumin: 4.1 g/dL (ref 3.5–5.2)
Alkaline Phosphatase: 55 U/L (ref 39–117)
BUN: 19 mg/dL (ref 6–23)
CO2: 30 mEq/L (ref 19–32)
Calcium: 9.1 mg/dL (ref 8.4–10.5)
Chloride: 105 mEq/L (ref 96–112)
Creatinine, Ser: 0.76 mg/dL (ref 0.40–1.20)
GFR: 80.79 mL/min (ref >60.00)
Glucose, Bld: 89 mg/dL (ref 70–99)
Potassium: 4.5 mEq/L (ref 3.5–5.1)
Sodium: 139 mEq/L (ref 135–145)
Total Bilirubin: 0.4 mg/dL (ref 0.2–1.2)
Total Protein: 7.2 g/dL (ref 6.0–8.3)

## 2021-05-30 LAB — AMYLASE: Amylase: 39 U/L (ref 27–131)

## 2021-05-30 LAB — LIPASE: Lipase: 13 U/L (ref 11.0–59.0)

## 2021-05-30 LAB — LIPID PANEL
Cholesterol: 108 mg/dL (ref 0–200)
HDL: 52.6 mg/dL (ref >39.00)
LDL Cholesterol: 42 mg/dL (ref 0–99)
NonHDL: 55.62
Total CHOL/HDL Ratio: 2
Triglycerides: 69 mg/dL (ref 0.0–149.0)
VLDL: 13.8 mg/dL (ref 0.0–40.0)

## 2021-05-30 LAB — VITAMIN D 25 HYDROXY (VIT D DEFICIENCY, FRACTURES): VITD: 57.81 ng/mL (ref 30.00–100.00)

## 2021-05-30 LAB — HEMOGLOBIN A1C: Hgb A1c MFr Bld: 6.3 % (ref 4.6–6.5)

## 2021-05-30 MED ORDER — OMEPRAZOLE 40 MG PO CPDR: 40.0000 mg | DELAYED_RELEASE_CAPSULE | Freq: Every day | ORAL | 3 refills | Status: DC

## 2021-05-30 NOTE — Progress Notes (Signed)
? ?New Patient Office Visit ? ?Subjective   ? ?Patient ID: Renee Kerr, female    DOB: 1955-11-02  Age: 66 y.o. MRN: [id number] ? ?CC:  ?Chief Complaint  ?Patient presents with  ? Establish Care  ?  NP/establish care concerns about leg pains and stomach issues food seems to just sit on stomach BM's abnormal. Patient fasting.   ? ? ?HPI ?Renee Kerr presents to establish care ?General health check and follow-up of some problems she has been having.  Deals with allergy rhinitis symptoms for this spring.  Currently taking Flonase intermittently and Pataday.  History of what sounds like reactive airway disease.  She tends to wheeze when she is ill.  Uses her albuterol rarely.  She experiences midepigastric heaviness with bloating.  Denies dysphagia.  There is no shortness of breath chest pain diaphoresis or nausea.  She uses on an acid with some relief.  Going history of constipation treated with a stool softener and Dulcolax.  She is due for next colonoscopy.  I had a female check in many years.  Ongoing problems with left knee and seeing orthopedics for this. ? ?Outpatient Encounter Medications as of 05/30/2021  ?Medication Sig  ? acetaminophen (TYLENOL) 500 MG tablet Take 1 tablet (500 mg total) by mouth every 6 (six) hours as needed.  ? albuterol (PROVENTIL HFA;VENTOLIN HFA) 108 (90 BASE) MCG/ACT inhaler Inhale 2 puffs into the lungs every 4 (four) hours as needed for wheezing or shortness of breath.  ? fluticasone (FLONASE) 50 MCG/ACT nasal spray 1 spray by Each Nare route daily as needed for rhinitis.  ? ibuprofen (ADVIL,MOTRIN) 200 MG tablet Take 200 mg by mouth every 6 (six) hours as needed for mild pain.  ? naproxen (NAPROSYN) 500 MG tablet Take 500 mg by mouth 2 (two) times daily.  ? Olopatadine HCl (PATADAY) 0.2 % SOLN Use twice a day for eye irritation  ? omeprazole (PRILOSEC) 40 MG capsule Take 1 capsule (40 mg total) by mouth daily.  ? Vitamin D, Ergocalciferol, (DRISDOL) 50000 UNITS CAPS Take  50,000 Units by mouth every 7 (seven) days. Take on saturdays  ? [DISCONTINUED] aspirin EC 81 MG tablet Take 81 mg by mouth daily. (Patient not taking: Reported on 05/30/2021)  ? [DISCONTINUED] diclofenac (CATAFLAM) 50 MG tablet Take 1 tablet (50 mg total) by mouth 3 (three) times daily. (Patient not taking: Reported on 05/30/2021)  ? [DISCONTINUED] ipratropium (ATROVENT) 0.06 % nasal spray Place 2 sprays into both nostrils 4 (four) times daily.  ? [DISCONTINUED] meclizine (ANTIVERT) 12.5 MG tablet Take 1 tablet (12.5 mg total) by mouth 3 (three) times daily as needed.  ? [DISCONTINUED] meloxicam (MOBIC) 15 MG tablet Take 15 mg by mouth daily. (Patient not taking: Reported on 05/30/2021)  ? [DISCONTINUED] tiZANidine (ZANAFLEX) 4 MG capsule Take 1 capsule (4 mg total) by mouth at bedtime as needed for muscle spasms.  ? ?No facility-administered encounter medications on file as of 05/30/2021.  ? ? ?Past Medical History:  ?Diagnosis Date  ? Asthma   ? Chest pain   ? Heart palpitations   ? Joint pain, knee   ? bilateral  ? Obesity   ? ? ?Past Surgical History:  ?Procedure Laterality Date  ? CESAREAN SECTION  1986  ? x2 with both children  ? ? ?Family History  ?Problem Relation Age of Onset  ? Heart attack Mother   ?     mid 61s  ? Heart attack Father   ?     68,  died with first MI  ? Hypertension Father   ? Diabetes Brother   ? Hypertension Brother   ? Cancer Maternal Grandmother   ?     cervical cancer  ? ? ?Social History  ? ?Socioeconomic History  ? Marital status: Married  ?  Spouse name: Fatiha Guzy  ? Number of children: 2  ? Years of education: Not on file  ? Highest education level: Not on file  ?Occupational History  ? Occupation: Midwife   ?  Employer: [employer]  ?Tobacco Use  ? Smoking status: Former  ?  Packs/day: 2.00  ?  Years: 2.00  ?  Pack years: 4.00  ?  Types: Cigarettes  ?  Quit date: 05/05/1984  ?  Years since quitting: 37.0  ? Smokeless tobacco: Never  ?Vaping Use  ? Vaping Use: Never used   ?Substance and Sexual Activity  ? Alcohol use: No  ? Drug use: Never  ? Sexual activity: Yes  ?Other Topics Concern  ? Not on file  ?Social History Narrative  ? Lives in [place] with husband. Has two sons one lives in [place] and one lives in [place]. 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  ? ?Social Determinants of Health  ? ?Financial Resource Strain: Not on file  ?Food Insecurity: Not on file  ?Transportation Needs: Not on file  ?Physical Activity: Not on file  ?Stress: Not on file  ?Social Connections: Not on file  ?Intimate Partner Violence: Not on file  ? ? ?Review of Systems  ?Constitutional:  Negative for chills, diaphoresis, malaise/fatigue and weight loss.  ?HENT: Negative.    ?Eyes: Negative.  Negative for blurred vision and double vision.  ?Respiratory:  Negative for cough and shortness of breath.   ?Cardiovascular:  Negative for chest pain.  ?Gastrointestinal:  Positive for constipation. Negative for abdominal pain, blood in stool, melena, nausea and vomiting.  ?Genitourinary: Negative.   ?Musculoskeletal:  Positive for joint pain. Negative for falls and myalgias.  ?Neurological:  Negative for speech change, loss of consciousness and weakness.  ?Psychiatric/Behavioral: Negative.    ? ?  ? ?  05/30/2021  ? 10:15 AM  ?Depression screen PHQ 2/9  ?Decreased Interest 0  ?Down, Depressed, Hopeless 0  ?PHQ - 2 Score 0  ? ? ? ? ? ?Objective   ? ?BP 124/72 (BP Location: Right Arm, Patient Position: Sitting, Cuff Size: Large)   Pulse 68   Temp 98.6 ?F (37 ?C) (Temporal)   Ht 4\' 11"  (1.499 m)   Wt 210 lb 3.2 oz (95.3 kg)   SpO2 96%   BMI 42.46 kg/m?  ? ?Physical Exam ?Constitutional:   ?   General: She is not in acute distress. ?   Appearance: Normal appearance. She is obese. She is not ill-appearing, toxic-appearing or diaphoretic.  ?HENT:  ?   Head: Normocephalic and atraumatic.  ?   Right Ear: Tympanic membrane, ear canal and external ear normal.  ?   Left Ear: Tympanic membrane, ear  canal and external ear normal.  ?   Mouth/Throat:  ?   Mouth: Mucous membranes are moist.  ?   Pharynx: Oropharynx is clear. No oropharyngeal exudate or posterior oropharyngeal erythema.  ?Eyes:  ?   General: No scleral icterus.    ?   Right eye: No discharge.     ?   Left eye: No discharge.  ?   Extraocular Movements: Extraocular movements intact.  ?   Conjunctiva/sclera: Conjunctivae normal.  ?  Pupils: Pupils are equal, round, and reactive to light.  ?Cardiovascular:  ?   Rate and Rhythm: Normal rate and regular rhythm.  ?Pulmonary:  ?   Effort: Pulmonary effort is normal. No respiratory distress.  ?   Breath sounds: Normal breath sounds.  ?Abdominal:  ?   General: Bowel sounds are normal.  ?   Tenderness: There is no guarding.  ?Musculoskeletal:  ?   Cervical back: No rigidity or tenderness.  ?   Left knee: Swelling present. No effusion. No tenderness.  ?Skin: ?   General: Skin is warm and dry.  ?Neurological:  ?   Mental Status: She is alert and oriented to person, place, and time.  ?Psychiatric:     ?   Mood and Affect: Mood normal.     ?   Behavior: Behavior normal.  ? ? ? ?  ? ?Assessment & Plan:  ? ?Problem List Items Addressed This Visit   ? ?  ? Digestive  ? Slow transit constipation  ?  ? Genitourinary  ? Vaginal dryness, menopausal  ? Relevant Orders  ? Ambulatory referral to Gynecology  ?  ? Other  ? Prediabetes  ? Relevant Orders  ? Hemoglobin A1c  ? Vitamin D deficiency - Primary  ? Relevant Orders  ? VITAMIN D 25 Hydroxy (Vit-D Deficiency, Fractures)  ? Bloating  ? Relevant Medications  ? omeprazole (PRILOSEC) 40 MG capsule  ? Epigastric heaviness  ? Relevant Medications  ? omeprazole (PRILOSEC) 40 MG capsule  ? Other Relevant Orders  ? Amylase  ? Lipase  ? Healthcare maintenance  ? Relevant Orders  ? CBC with Differential/Platelet  ? Comprehensive metabolic panel  ? Lipid panel  ? Urinalysis  ? ?Other Visit Diagnoses   ? ? Screen for colon cancer      ? Relevant Orders  ? Ambulatory referral to  Gastroenterology  ? ?  ? ? ?Return in about 3 months (around 08/29/2021).  ?Trial of omeprazole for mid epigastric bloating and heaviness.  We will send for female check GYN.  Due for colonoscopy.  Advised to continue F

## 2021-06-19 DIAGNOSIS — J4520 Mild intermittent asthma, uncomplicated: Secondary | ICD-10-CM

## 2021-06-29 NOTE — Telephone Encounter (Signed)
Please advise 

## 2021-07-02 ENCOUNTER — Other Ambulatory Visit: Payer: Self-pay

## 2021-07-02 DIAGNOSIS — Z0000 Encounter for general adult medical examination without abnormal findings: Secondary | ICD-10-CM

## 2021-07-02 NOTE — Telephone Encounter (Signed)
Please advise 

## 2021-07-02 NOTE — Progress Notes (Signed)
Per Dr.Kremer mammogram order has been placed at [facility].   Jaliah Foody L. CMA/CPT

## 2021-07-03 NOTE — Telephone Encounter (Signed)
FYI

## 2021-07-04 ENCOUNTER — Encounter: Payer: Self-pay | Admitting: Family Medicine

## 2021-07-04 ENCOUNTER — Ambulatory Visit (INDEPENDENT_AMBULATORY_CARE_PROVIDER_SITE_OTHER): Payer: Medicare Other | Admitting: Family Medicine

## 2021-07-04 ENCOUNTER — Ambulatory Visit (INDEPENDENT_AMBULATORY_CARE_PROVIDER_SITE_OTHER): Payer: Medicare Other

## 2021-07-04 VITALS — BP 124/68 | HR 73 | Temp 97.9°F | Ht 59.0 in | Wt 205.2 lb

## 2021-07-04 DIAGNOSIS — J4520 Mild intermittent asthma, uncomplicated: Secondary | ICD-10-CM

## 2021-07-04 DIAGNOSIS — R059 Cough, unspecified: Secondary | ICD-10-CM | POA: Diagnosis not present

## 2021-07-04 DIAGNOSIS — M25562 Pain in left knee: Secondary | ICD-10-CM

## 2021-07-04 DIAGNOSIS — G8929 Other chronic pain: Secondary | ICD-10-CM

## 2021-07-04 DIAGNOSIS — B349 Viral infection, unspecified: Secondary | ICD-10-CM | POA: Diagnosis not present

## 2021-07-04 LAB — CBC
HCT: 39.1 % (ref 36.0–46.0)
Hemoglobin: 12.4 g/dL (ref 12.0–15.0)
MCHC: 31.6 g/dL (ref 30.0–36.0)
MCV: 81 fl (ref 78.0–100.0)
Platelets: 229 10*3/uL (ref 150.0–400.0)
RBC: 4.83 Mil/uL (ref 3.87–5.11)
RDW: 15 % (ref 11.5–15.5)
WBC: 6.8 10*3/uL (ref 4.0–10.5)

## 2021-07-04 MED ORDER — ALBUTEROL SULFATE HFA 108 (90 BASE) MCG/ACT IN AERS: 2.0000 | INHALATION_SPRAY | RESPIRATORY_TRACT | 0 refills | Status: DC | PRN

## 2021-07-04 MED ORDER — PREDNISONE 5 MG PO TABS: 5.0000 mg | ORAL_TABLET | Freq: Two times a day (BID) | ORAL | 0 refills | Status: AC

## 2021-07-04 NOTE — Progress Notes (Signed)
Established Patient Office Visit  Subjective   Patient ID: Renee Kerr, female    DOB: [birth year]  Age: 66 y.o. MRN: [id number]  Chief Complaint  Patient presents with   Cough    Cough, runny nose and sore throat x 5 days.     Cough Associated symptoms include headaches, a sore throat and wheezing. Pertinent negatives include no chest pain, chills, ear pain, myalgias, shortness of breath or weight loss.  presents for 5-day history of URI signs and symptoms with headache, nasal congestion postnasal drip sore throat cough with reactive airway, congestion and headache.  Subjective fever.  Increased inhaler use.  Ongoing right knee pain.  She has seen an orthopedic doctor in the past who noted arthritis and possibility of a stress fracture after a CT scan was obtained.  She was accompanied by her daughter Aggie Cosier today.    Review of Systems  Constitutional:  Negative for chills, diaphoresis, malaise/fatigue and weight loss.  HENT:  Positive for congestion, hearing loss and sore throat. Negative for ear discharge and ear pain.   Eyes: Negative.  Negative for blurred vision and double vision.  Respiratory:  Positive for cough and wheezing. Negative for shortness of breath.   Cardiovascular:  Negative for chest pain.  Gastrointestinal:  Negative for abdominal pain, nausea and vomiting.  Genitourinary: Negative.   Musculoskeletal:  Positive for joint pain. Negative for falls and myalgias.  Neurological:  Positive for headaches. Negative for speech change, loss of consciousness and weakness.  Psychiatric/Behavioral: Negative.       Objective:     BP 124/68 (BP Location: Right Arm, Patient Position: Sitting, Cuff Size: Large)   Pulse 73   Temp 97.9 F (36.6 C) (Temporal)   Ht 4\' 11"  (1.499 m)   Wt 205 lb 3.2 oz (93.1 kg)   SpO2 95%   BMI 41.45 kg/m  Wt Readings from Last 3 Encounters:  07/04/21 205 lb 3.2 oz (93.1 kg)  05/30/21 210 lb 3.2 oz (95.3 kg)  04/23/21 214 lb (97.1  kg)      Physical Exam Constitutional:      General: She is not in acute distress.    Appearance: Normal appearance. She is not ill-appearing, toxic-appearing or diaphoretic.  HENT:     Head: Normocephalic and atraumatic.     Right Ear: External ear normal.     Left Ear: External ear normal.  Eyes:     General: No scleral icterus.       Right eye: No discharge.        Left eye: No discharge.     Extraocular Movements: Extraocular movements intact.     Conjunctiva/sclera: Conjunctivae normal.     Pupils: Pupils are equal, round, and reactive to light.  Cardiovascular:     Rate and Rhythm: Normal rate and regular rhythm.  Pulmonary:     Effort: Pulmonary effort is normal. No respiratory distress.     Breath sounds: Normal breath sounds. No wheezing, rhonchi or rales.  Abdominal:     General: Bowel sounds are normal.     Tenderness: There is no abdominal tenderness. There is no guarding.  Musculoskeletal:     Cervical back: No rigidity or tenderness.     Left knee: No erythema. Normal range of motion.  Lymphadenopathy:     Cervical: No cervical adenopathy.  Skin:    General: Skin is warm and dry.  Neurological:     Mental Status: She is alert and oriented to person, place, and  time.  Psychiatric:        Mood and Affect: Mood normal.        Behavior: Behavior normal.     No results found for any visits on 07/04/21.    The ASCVD Risk score (Arnett DK, et al., 2019) failed to calculate for the following reasons:   The valid total cholesterol range is 130 to 320 mg/dL    Assessment & Plan:   Problem List Items Addressed This Visit       Other   Chronic pain of left knee - Primary   Relevant Medications   predniSONE (DELTASONE) 5 MG tablet   Other Relevant Orders   DG Knee Complete 4 Views Left   Ambulatory referral to Sports Medicine   Viral syndrome   Relevant Orders   CBC   DG Chest 2 View   Novel Coronavirus, NAA ([facility])   Other Visit Diagnoses      Mild intermittent reactive airway disease without complication       Relevant Medications   predniSONE (DELTASONE) 5 MG tablet   albuterol (VENTOLIN HFA) 108 (90 Base) MCG/ACT inhaler   Other Relevant Orders   DG Chest 2 View       Return in about 1 week (around 07/11/2021), or if symptoms worsen or fail to improve.  Brief course of low-dose prednisone.  Continue albuterol inhaler..  Follow-up next week if not improving.  We will check chest x-ray and CBC.  X-rays of left knee and referral to sports medicine.  Mliss Sax, MD

## 2021-07-05 ENCOUNTER — Encounter: Payer: Self-pay | Admitting: Family Medicine

## 2021-07-05 MED ORDER — BENZONATATE 200 MG PO CAPS: 200.0000 mg | ORAL_CAPSULE | Freq: Two times a day (BID) | ORAL | 0 refills | Status: DC | PRN

## 2021-07-05 NOTE — Telephone Encounter (Signed)
Called patient went over xray results with patient

## 2021-07-11 ENCOUNTER — Encounter: Payer: Self-pay | Admitting: Family Medicine

## 2021-07-11 ENCOUNTER — Ambulatory Visit (INDEPENDENT_AMBULATORY_CARE_PROVIDER_SITE_OTHER): Payer: Medicare Other | Admitting: Family Medicine

## 2021-07-11 VITALS — BP 118/70 | HR 66 | Temp 97.4°F | Ht 59.0 in | Wt 204.8 lb

## 2021-07-11 DIAGNOSIS — J4540 Moderate persistent asthma, uncomplicated: Secondary | ICD-10-CM

## 2021-07-11 DIAGNOSIS — J301 Allergic rhinitis due to pollen: Secondary | ICD-10-CM | POA: Diagnosis not present

## 2021-07-11 MED ORDER — FLUTICASONE-SALMETEROL 250-50 MCG/ACT IN AEPB: 1.0000 | INHALATION_SPRAY | Freq: Two times a day (BID) | RESPIRATORY_TRACT | 5 refills | Status: AC

## 2021-07-11 MED ORDER — AZELASTINE HCL 0.1 % NA SOLN: 1.0000 | Freq: Two times a day (BID) | NASAL | 12 refills | Status: AC

## 2021-07-11 NOTE — Progress Notes (Signed)
Established Patient Office Visit  Subjective   Patient ID: Renee Kerr, female    DOB: [birth year]  Age: 66 y.o. MRN: [id number]  Chief Complaint  Patient presents with   Follow-up    Follow up on cough, little improvement still bad at night. Would like to steroid inhaler to help.     HPI cough with wheezing and tightness in the chest persists.  Low-dose prednisone helped some but she would rather not take it again.  Albuterol inhaler is only temporarily helpful.  She has copious postnasal drip despite regular Flonase use.  History of asthma has worsened since she moved to this area years ago.    Review of Systems  Constitutional:  Negative for chills, diaphoresis, malaise/fatigue and weight loss.  HENT: Negative.    Eyes: Negative.  Negative for blurred vision and double vision.  Respiratory:  Positive for cough and wheezing. Negative for sputum production.   Cardiovascular:  Negative for chest pain.  Gastrointestinal:  Negative for abdominal pain.  Genitourinary: Negative.   Musculoskeletal:  Negative for falls and myalgias.  Neurological:  Negative for speech change, loss of consciousness and weakness.  Psychiatric/Behavioral: Negative.       Objective:     BP 118/70 (BP Location: Right Arm, Patient Position: Sitting, Cuff Size: Large)   Pulse 66   Temp (!) 97.4 F (36.3 C) (Temporal)   Ht 4\' 11"  (1.499 m)   Wt 204 lb 12.8 oz (92.9 kg)   SpO2 96%   BMI 41.36 kg/m    Physical Exam Constitutional:      General: She is not in acute distress.    Appearance: Normal appearance. She is not ill-appearing, toxic-appearing or diaphoretic.  HENT:     Head: Normocephalic and atraumatic.     Right Ear: External ear normal.     Left Ear: External ear normal.     Mouth/Throat:     Mouth: Mucous membranes are moist.     Pharynx: Oropharynx is clear. No oropharyngeal exudate or posterior oropharyngeal erythema.  Eyes:     General: No scleral icterus.       Right eye: No  discharge.        Left eye: No discharge.     Extraocular Movements: Extraocular movements intact.     Conjunctiva/sclera: Conjunctivae normal.     Pupils: Pupils are equal, round, and reactive to light.  Cardiovascular:     Rate and Rhythm: Normal rate and regular rhythm.  Pulmonary:     Effort: Pulmonary effort is normal. No respiratory distress.     Breath sounds: Normal breath sounds. No wheezing or rales.  Abdominal:     General: Bowel sounds are normal.     Tenderness: There is no abdominal tenderness. There is no guarding.  Musculoskeletal:     Cervical back: No rigidity or tenderness.  Skin:    General: Skin is warm and dry.  Neurological:     Mental Status: She is alert and oriented to person, place, and time.  Psychiatric:        Mood and Affect: Mood normal.        Behavior: Behavior normal.     No results found for any visits on 07/11/21.    The ASCVD Risk score (Arnett DK, et al., 2019) failed to calculate for the following reasons:   The valid total cholesterol range is 130 to 320 mg/dL    Assessment & Plan:   Problem List Items Addressed This Visit  Respiratory   Allergic rhinitis   Relevant Medications   azelastine (ASTELIN) 0.1 % nasal spray   Persistent asthma - Primary   Relevant Medications   fluticasone-salmeterol (ADVAIR DISKUS) 250-50 MCG/ACT AEPB    Return in about 2 weeks (around 07/25/2021), or if symptoms worsen or fail to improve.  Continue daily Flonase spray.  Have added Astelin to help with ongoing postnasal drip.  Have added Advair for better control of asthma.  Information was given on asthma as well as Advair.  Mliss Sax, MD

## 2021-07-15 NOTE — Progress Notes (Unsigned)
    Subjective:    CC: B? knee pain  I, Molly Weber, LAT, ATC, am serving as scribe for Dr. [name].  HPI: Pt is a 66 y/o female presenting w/ c/o B? knee pain x .  She locates her pain to .  Knee swelling: Knee mechanical symptoms: Aggravating factors: Treatments tried:   Diagnostic testing: L knee XR- 07/04/21  Pertinent review of Systems: ***  Relevant historical information: ***   Objective:   There were no vitals filed for this visit. General: Well Developed, well nourished, and in no acute distress.   MSK: ***  Lab and Radiology Results No results found for this or any previous visit (from the past 72 hour(s)). No results found.    Impression and Recommendations:    Assessment and Plan: 66 y.o. female with ***.  PDMP not reviewed this encounter. No orders of the defined types were placed in this encounter.  No orders of the defined types were placed in this encounter.   Discussed warning signs or symptoms. Please see discharge instructions. Patient expresses understanding.   ***

## 2021-07-16 ENCOUNTER — Ambulatory Visit: Payer: Medicare Other | Admitting: Family Medicine

## 2021-07-16 ENCOUNTER — Ambulatory Visit: Payer: Self-pay

## 2021-07-16 VITALS — BP 140/82 | HR 67 | Ht 59.0 in | Wt 206.8 lb

## 2021-07-16 DIAGNOSIS — Z6841 Body Mass Index (BMI) 40.0 and over, adult: Secondary | ICD-10-CM | POA: Diagnosis not present

## 2021-07-16 DIAGNOSIS — M25561 Pain in right knee: Secondary | ICD-10-CM | POA: Diagnosis not present

## 2021-07-16 DIAGNOSIS — M25562 Pain in left knee: Secondary | ICD-10-CM | POA: Diagnosis not present

## 2021-07-16 DIAGNOSIS — G8929 Other chronic pain: Secondary | ICD-10-CM

## 2021-07-16 NOTE — Patient Instructions (Addendum)
Thank you for coming in today.   [facility]:   [id number] [place], [place] [address] Call: [phone] Fax: [phone]

## 2021-07-17 ENCOUNTER — Ambulatory Visit: Payer: Medicare Other

## 2021-07-24 ENCOUNTER — Ambulatory Visit
Admission: RE | Admit: 2021-07-24 | Discharge: 2021-07-24 | Disposition: A | Discharge status: Home / Self Care | Payer: Medicare Other | Source: Ambulatory Visit | Attending: Family Medicine | Admitting: Family Medicine

## 2021-07-24 DIAGNOSIS — Z1231 Encounter for screening mammogram for malignant neoplasm of breast: Secondary | ICD-10-CM | POA: Diagnosis not present

## 2021-07-24 DIAGNOSIS — Z0000 Encounter for general adult medical examination without abnormal findings: Secondary | ICD-10-CM

## 2021-07-25 ENCOUNTER — Telehealth: Payer: Self-pay | Admitting: Family Medicine

## 2021-07-25 NOTE — Telephone Encounter (Signed)
Pt stated that she needs a phone number from whom will be calling her on her appointment on 6.21.23 @ 12:45 because her telephone will not pick up any numbers  that is not in her contact file it will through any numbers that is not stored

## 2021-07-26 ENCOUNTER — Other Ambulatory Visit: Payer: Self-pay | Admitting: Family Medicine

## 2021-07-26 DIAGNOSIS — R928 Other abnormal and inconclusive findings on diagnostic imaging of breast: Secondary | ICD-10-CM

## 2021-07-26 NOTE — Telephone Encounter (Signed)
Returned patients call informed that the call should be coming from one of the [place] number [phone]

## 2021-07-30 ENCOUNTER — Encounter: Payer: Self-pay | Admitting: Family Medicine

## 2021-07-31 ENCOUNTER — Ambulatory Visit: Payer: Medicare Other

## 2021-08-06 ENCOUNTER — Ambulatory Visit (INDEPENDENT_AMBULATORY_CARE_PROVIDER_SITE_OTHER): Payer: Medicare Other

## 2021-08-06 DIAGNOSIS — Z0000 Encounter for general adult medical examination without abnormal findings: Secondary | ICD-10-CM | POA: Diagnosis not present

## 2021-08-09 ENCOUNTER — Ambulatory Visit: Payer: Medicare Other | Admitting: Family Medicine

## 2021-08-09 ENCOUNTER — Ambulatory Visit: Payer: Self-pay

## 2021-08-09 VITALS — BP 120/68 | HR 68 | Ht 59.0 in | Wt 205.0 lb

## 2021-08-09 DIAGNOSIS — M25562 Pain in left knee: Secondary | ICD-10-CM

## 2021-08-09 DIAGNOSIS — M25561 Pain in right knee: Secondary | ICD-10-CM | POA: Diagnosis not present

## 2021-08-09 DIAGNOSIS — G8929 Other chronic pain: Secondary | ICD-10-CM

## 2021-08-09 DIAGNOSIS — M1712 Unilateral primary osteoarthritis, left knee: Secondary | ICD-10-CM

## 2021-08-09 DIAGNOSIS — Z6841 Body Mass Index (BMI) 40.0 and over, adult: Secondary | ICD-10-CM

## 2021-08-09 NOTE — Progress Notes (Signed)
I, Renee Kerr, LAT, ATC acting as a scribe for Renee Graham, MD.  Renee Kerr is a 66 y.o. female who presents to [facility] today for continued bilat knee pain thought primarily due to DJD medial compartment but also due to mechanical limitations and how she could move her leg due to significant adipose tissue collection along the medial aspect of her thigh and knee. Pt was last seen by Dr. Denyse Kerr on 07/16/21 and was advised to work on weight loss. Pt sent a MyChart message on 07/30/21 c/o continue knee pain and was interested in pursuing steroid injections. Pt was advised to call the office to schedule a visit. Today, pt reports the knee pain is about the same. 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   Dx imaging: 07/04/21 L knee XR  Pertinent review of systems: No fevers or chills  Relevant historical information: Asthma.  Osteoporosis.  Obesity.   Exam:  BP 120/68   Pulse 68   Ht 4\' 11"  (1.499 m)   Wt 205 lb (93 kg)   SpO2 98%   BMI 41.40 kg/m  General: Well Developed, well nourished, and in no acute distress.   MSK: Left knee obesity surrounds the knee especially in the medial aspect of the knee and thigh. Normal knee motion with crepitation.  Tender palpation medial joint line.    Lab and Radiology Results  Procedure: Real-time Ultrasound Guided Injection of left knee superior lateral patellar space Device: Philips Affiniti 50G Images permanently stored and available for review in PACS Verbal informed consent obtained.  Discussed risks and benefits of procedure. Warned about infection, bleeding, hyperglycemia damage to structures among others. Patient expresses understanding and agreement Time-out conducted.   Noted no overlying erythema, induration, or other signs of local infection.   Skin prepped in a sterile fashion.    Local anesthesia: Topical Ethyl chloride.   With sterile technique and under real time ultrasound guidance: 40 mg of Kenalog and 2 mm of Marcaine injected into knee joint. Fluid seen entering the joint capsule.   Completed without difficulty   Spinal needle used Pain immediately resolved suggesting accurate placement of the medication.   Advised to call if fevers/chills, erythema, induration, drainage, or persistent bleeding.   Images permanently stored and available for review in the ultrasound unit.  Impression: Technically successful ultrasound guided injection.  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  EXAM: LEFT KNEE - COMPLETE 4+ VIEW   COMPARISON:  Left knee radiographs 09/20/2019   FINDINGS: Left knee:   Mild medial compartment joint space narrowing. Mild chronic enthesopathic change at the quadriceps insertion of the patella.   Moderate chronic enthesopathic change at the proximal patellar origin with additional moderate inferior elongation of the patellar pole, likely from chronic traction related changes at the patellar tendon origin. Minimal enthesopathic change at the patellar insertion on the tibial tubercle. Moderate lateral patellar degenerative osteophytosis. No acute fracture is seen. No dislocation. No joint effusion.   Frontal view of the right knee demonstrates mild medial joint space narrowing and peripheral degenerative spurring.   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  Electronically Signed   By: Renee Kerr M.D.   On: 07/05/2021 14:38   I, Renee Kerr, personally (independently) visualized and performed the interpretation of the images  attached in this note.     Assessment and Plan: 66 y.o. female with left knee pain due to DJD exacerbation.  Steroid injection today.  She is actively working on weight loss which will be helpful.  Check back as needed.   PDMP not reviewed this encounter. Orders Placed This Encounter  Procedures   [place] LIMITED JOINT SPACE STRUCTURES LOW BILAT(NO LINKED CHARGES)    Order Specific Question:   Reason for Exam (SYMPTOM  OR DIAGNOSIS REQUIRED)    Answer:   bilateral knee pain    Order Specific Question:   Preferred imaging location?    Answer:   [facility]   No orders of the defined types were placed in this encounter.    Discussed warning signs or symptoms. Please see discharge instructions. Patient expresses understanding.   The above documentation has been reviewed and is accurate and complete Renee Kerr, M.D.

## 2021-08-09 NOTE — Patient Instructions (Addendum)
Good to see you  Injection given today in left knee Call or go to the ER if you develop a large red swollen joint with extreme pain or oozing puss.   Follow up as needed

## 2021-08-12 ENCOUNTER — Ambulatory Visit
Admission: RE | Admit: 2021-08-12 | Discharge: 2021-08-12 | Disposition: A | Discharge status: Home / Self Care | Payer: Medicare Other | Source: Ambulatory Visit | Attending: Family Medicine | Admitting: Family Medicine

## 2021-08-12 DIAGNOSIS — R928 Other abnormal and inconclusive findings on diagnostic imaging of breast: Secondary | ICD-10-CM

## 2021-08-25 ENCOUNTER — Other Ambulatory Visit: Payer: Self-pay | Admitting: Family Medicine

## 2021-08-25 DIAGNOSIS — R140 Abdominal distension (gaseous): Secondary | ICD-10-CM

## 2021-08-25 DIAGNOSIS — R198 Other specified symptoms and signs involving the digestive system and abdomen: Secondary | ICD-10-CM

## 2021-08-29 ENCOUNTER — Ambulatory Visit (INDEPENDENT_AMBULATORY_CARE_PROVIDER_SITE_OTHER): Payer: Medicare Other | Admitting: Family Medicine

## 2021-08-29 ENCOUNTER — Encounter: Payer: Self-pay | Admitting: Family Medicine

## 2021-08-29 ENCOUNTER — Ambulatory Visit: Payer: Medicare Other | Admitting: Family Medicine

## 2021-08-29 VITALS — BP 144/80 | HR 94 | Temp 98.3°F | Ht 59.0 in | Wt 205.8 lb

## 2021-08-29 DIAGNOSIS — J301 Allergic rhinitis due to pollen: Secondary | ICD-10-CM | POA: Diagnosis not present

## 2021-08-29 DIAGNOSIS — H6983 Other specified disorders of Eustachian tube, bilateral: Secondary | ICD-10-CM

## 2021-08-29 MED ORDER — PREDNISONE 10 MG PO TABS: 10.0000 mg | ORAL_TABLET | Freq: Two times a day (BID) | ORAL | 0 refills | Status: AC

## 2021-08-29 NOTE — Progress Notes (Addendum)
Established Patient Office Visit  Subjective   Patient ID: Renee Kerr, female    DOB: Jun 30, 1955  Age: 66 y.o. MRN: [id number]  Chief Complaint  Patient presents with   Sinus Problem    Sinus pressure and headache, sore throat, ear pain, runny nose symptoms x 3 weeks medication helped for a few days but now back ans worse.     HPI 3-week history of URI signs and symptoms nasal congestion postnasal drip itchy watery eyes sneezing.  Ears are congested as well. No ho htn.     Review of Systems  Constitutional:  Negative for chills, diaphoresis, malaise/fatigue and weight loss.  HENT:  Positive for congestion and hearing loss. Negative for sore throat.   Eyes: Negative.  Negative for blurred vision and double vision.  Respiratory:  Negative for cough.   Cardiovascular:  Negative for chest pain.  Gastrointestinal:  Negative for abdominal pain.  Genitourinary: Negative.   Musculoskeletal:  Negative for falls and myalgias.  Neurological:  Negative for dizziness, speech change, loss of consciousness, weakness and headaches.  Psychiatric/Behavioral: Negative.        Objective:     BP (!) 144/80 (BP Location: Right Arm, Patient Position: Sitting, Cuff Size: Large)   Pulse 94   Temp 98.3 F (36.8 C) (Temporal)   Ht 4\' 11"  (1.499 m)   Wt 205 lb 12.8 oz (93.4 kg)   SpO2 94%   BMI 41.57 kg/m  BP Readings from Last 3 Encounters:  08/29/21 (!) 144/80  08/09/21 120/68  07/16/21 140/82   Wt Readings from Last 3 Encounters:  08/29/21 205 lb 12.8 oz (93.4 kg)  08/09/21 205 lb (93 kg)  07/16/21 206 lb 12.8 oz (93.8 kg)      Physical Exam Constitutional:      General: She is not in acute distress.    Appearance: Normal appearance. She is not ill-appearing, toxic-appearing or diaphoretic.  HENT:     Head: Normocephalic and atraumatic.     Right Ear: Ear canal and external ear normal. No middle ear effusion. Tympanic membrane is retracted. Tympanic membrane is not  erythematous.     Left Ear: Ear canal and external ear normal.  No middle ear effusion. Tympanic membrane is not erythematous.     Mouth/Throat:     Mouth: Mucous membranes are moist.     Pharynx: Oropharynx is clear. No oropharyngeal exudate or posterior oropharyngeal erythema.  Eyes:     General: No scleral icterus.       Right eye: No discharge.        Left eye: No discharge.     Extraocular Movements: Extraocular movements intact.     Conjunctiva/sclera: Conjunctivae normal.     Pupils: Pupils are equal, round, and reactive to light.  Cardiovascular:     Rate and Rhythm: Normal rate and regular rhythm.  Pulmonary:     Effort: Pulmonary effort is normal. No respiratory distress.     Breath sounds: Normal breath sounds.  Abdominal:     General: Bowel sounds are normal.     Tenderness: There is no abdominal tenderness. There is no guarding.  Musculoskeletal:     Cervical back: No rigidity or tenderness.  Skin:    General: Skin is warm and dry.  Neurological:     Mental Status: She is alert and oriented to person, place, and time.  Psychiatric:        Mood and Affect: Mood normal.        Behavior:  Behavior normal.      No results found for any visits on 08/29/21.    The ASCVD Risk score (Arnett DK, et al., 2019) failed to calculate for the following reasons:   The valid total cholesterol range is 130 to 320 mg/dL    Assessment & Plan:   Problem List Items Addressed This Visit       Respiratory   Seasonal allergic rhinitis due to pollen - Primary   Relevant Orders   Ambulatory referral to Allergy     Nervous and Auditory   Dysfunction of both eustachian tubes    Return Use Flonase and Astelin.  Remember eustachian tube exercises. No ho htn. Check and record Bps periodically. Return in a few months for a recheck.    Mliss Sax, MD

## 2021-09-04 ENCOUNTER — Telehealth: Payer: Self-pay | Admitting: Family Medicine

## 2021-09-04 NOTE — Telephone Encounter (Signed)
Patient verbally understood message below agrees to give [place] a call if symptoms worsen or no improvement.

## 2021-09-04 NOTE — Telephone Encounter (Signed)
Spoke with patient who states that she feels as if she is having side effects to prednisone. Patient states that for the passed 2 days she's been very light headed to the point where she needs to lay down and not walk, she's also had some vomiting after meals. Wondering if there's anything she could do. She have 2 more days left of Prednisone but does not want to take no more she also states that she can still taste the medicine after not taking for 2 days. Please advise.

## 2021-09-04 NOTE — Telephone Encounter (Signed)
Caller Name: Renee Kerr Call back phone #: [phone]  Reason for Call: Believes she is having negative reaction to Prednisone. She has been lightheaded with feelings of fatigue, throwing up. Did not want to speak with nurse triage, said she would wait for [facility]

## 2021-09-12 ENCOUNTER — Encounter: Payer: Self-pay | Admitting: Internal Medicine

## 2021-10-15 ENCOUNTER — Encounter: Payer: Self-pay | Admitting: Internal Medicine

## 2021-10-15 ENCOUNTER — Ambulatory Visit: Payer: Medicare Other | Admitting: Internal Medicine

## 2021-10-15 VITALS — BP 120/76 | HR 67 | Ht 59.0 in | Wt 210.1 lb

## 2021-10-15 DIAGNOSIS — K219 Gastro-esophageal reflux disease without esophagitis: Secondary | ICD-10-CM | POA: Diagnosis not present

## 2021-10-15 DIAGNOSIS — K649 Unspecified hemorrhoids: Secondary | ICD-10-CM | POA: Diagnosis not present

## 2021-10-15 DIAGNOSIS — Z1211 Encounter for screening for malignant neoplasm of colon: Secondary | ICD-10-CM

## 2021-10-15 DIAGNOSIS — R1310 Dysphagia, unspecified: Secondary | ICD-10-CM

## 2021-10-15 MED ORDER — HYDROCORTISONE (PERIANAL) 2.5 % EX CREA: 1.0000 | TOPICAL_CREAM | Freq: Two times a day (BID) | CUTANEOUS | 1 refills | Status: DC

## 2021-10-15 NOTE — Patient Instructions (Signed)
_______________________________________________________  If you are age 68 or older, your body mass index should be between 23-30. Your Body mass index is 42.44 kg/m. If this is out of the aforementioned range listed, please consider follow up with your Primary Care Provider.  If you are age 76 or younger, your body mass index should be between 19-25. Your Body mass index is 42.44 kg/m. If this is out of the aformentioned range listed, please consider follow up with your Primary Care Provider.   It has been recommended to you by your physician that you have a(n) Endoscopy and Colonoscopy completed. Per your request, we did not schedule the procedure(s) today. Please contact our office at [phone] should you decide to have the procedure completed. You will be scheduled for a pre-visit and procedure at that time.  Please call our office to schedule your procedure in November.  We have sent the following medications to your pharmacy for you to pick up at your convenience:Anus[facility] - cream. Use for 7 days.   The [facility] providers would like to encourage you to use [facility] to communicate with providers for non-urgent requests or questions.  Due to long hold times on the telephone, sending your provider a message by [facility] may be a faster and more efficient way to get a response.  Please allow 48 business hours for a response.  Please remember that this is for non-urgent requests.   Thank you for entrusting me with your care and for choosing [facility], Dr. Eulah Pont  _______________________________________________________

## 2021-10-15 NOTE — Progress Notes (Signed)
Chief Complaint: GERD, colon cancer screening  HPI : 66 year old female with history of asthma and obesity presents with GERD and to discuss colon cancer screening  She had a colonoscopy and EGD in 2013 that was normal. Prior to that she had a colonoscopy that did show precancerous colon polyps. She has a hemorrhoid that she can feel. Denies rectal pain, rectal itching, and rectal bleeding. She started developing acid reflux symptoms about 6 years ago, which has been worsening over time. She feels like food sits in her chest/stomach for a long time. Denies chest burning or regurgitation. She uses Pepto Bismol or baking soda. Whenever she eats beef, it is harder to digest. Denies N&V, ab pain, weight loss and diarrhea. She has constipation occasionally. On average has 2 BMs per day. She does take omeprazole 40 mg QD and she does say that it helps with her reflux. Denies family history of GI cancers.  Past Medical History:  Diagnosis Date   Asthma    Chest pain    Heart palpitations    Joint pain, knee    bilateral   Obesity    Past Surgical History:  Procedure Laterality Date   CESAREAN SECTION  02/11/1984   x2 with both children   COLONOSCOPY WITH ESOPHAGOGASTRODUODENOSCOPY (EGD)  2013   Family History  Problem Relation Age of Onset   Heart attack Mother        mid 44s   Heart attack Father        48, died with first MI   Hypertension Father    Diabetes Brother    Hypertension Brother    Cancer Maternal Grandmother        cervical cancer   Social History   Tobacco Use   Smoking status: Former    Packs/day: 2.00    Years: 2.00    Total pack years: 4.00    Types: Cigarettes    Quit date: 05/05/1984    Years since quitting: 37.4   Smokeless tobacco: Never  Vaping Use   Vaping Use: Never used  Substance Use Topics   Alcohol use: No   Drug use: Never   Current Outpatient Medications  Medication Sig Dispense Refill   acetaminophen (TYLENOL) 500 MG tablet Take 1 tablet  (500 mg total) by mouth every 6 (six) hours as needed. 30 tablet 0   albuterol (VENTOLIN HFA) 108 (90 Base) MCG/ACT inhaler Inhale 2 puffs into the lungs every 4 (four) hours as needed for wheezing or shortness of breath. 1 each 0   azelastine (ASTELIN) 0.1 % nasal spray Place 1 spray into both nostrils 2 (two) times daily. Use in each nostril as directed 30 mL 12   benzonatate (TESSALON) 200 MG capsule Take 1 capsule (200 mg total) by mouth 2 (two) times daily as needed for cough. 20 capsule 0   fluticasone (FLONASE) 50 MCG/ACT nasal spray      fluticasone-salmeterol (ADVAIR DISKUS) 250-50 MCG/ACT AEPB Inhale 1 puff into the lungs in the morning and at bedtime. 1 each 5   ibuprofen (ADVIL,MOTRIN) 200 MG tablet Take 200 mg by mouth every 6 (six) hours as needed for mild pain.     Olopatadine HCl (PATADAY) 0.2 % SOLN Use twice a day for eye irritation 2.5 mL 0   omeprazole (PRILOSEC) 40 MG capsule TAKE 1 CAPSULE (40 MG TOTAL) BY MOUTH DAILY. 90 capsule 1   Vitamin D, Ergocalciferol, (DRISDOL) 50000 UNITS CAPS Take 50,000 Units by mouth every 7 (seven)  days. Take on saturdays     No current facility-administered medications for this visit.   No Known Allergies   Review of Systems: All systems reviewed and negative except where noted in HPI.   Physical Exam: BP 120/76   Pulse 67   Ht 4\' 11"  (1.499 m)   Wt 210 lb 2 oz (95.3 kg)   BMI 42.44 kg/m  Constitutional: Pleasant,well-developed, female in no acute distress. HEENT: Normocephalic and atraumatic. Conjunctivae are normal. No scleral icterus. Cardiovascular: Normal rate, regular rhythm.  Pulmonary/chest: Effort normal and breath sounds normal. No wheezing, rales or rhonchi. Abdominal: Soft, nondistended, nontender. Bowel sounds active throughout. There are no masses palpable. No hepatomegaly. Extremities: No edema Neurological: Alert and oriented to person place and time. Skin: Skin is warm and dry. No rashes noted. Psychiatric:  Normal mood and affect. Behavior is normal.  Labs 05/2021: CMP and lipase unremarkable. HbA1C 6.3%.   Labs 06/2021: CBC nml.   ASSESSMENT AND PLAN: GERD Barrett's screening Dysphagia Colon cancer screening Hemorrhoids Patient presents with worsening GERD and what sounds like dysphagia. Alternatively could consider gastroparesis as an alternative explanation. She is also due for colon cancer screening. Will plan for EGD and colonoscopy for further evaluation. I went over both procedures with the patient, and she is agreeable to proceeding. - Continue PPI QD for now - Anusol HC cream BID for 7 days - EGD/colonoscopy [facility]  07/2021, MD

## 2021-10-17 ENCOUNTER — Ambulatory Visit: Payer: Self-pay | Admitting: Internal Medicine

## 2021-11-26 ENCOUNTER — Ambulatory Visit: Payer: Self-pay | Admitting: Allergy & Immunology

## 2021-12-25 ENCOUNTER — Encounter: Payer: Self-pay | Admitting: Obstetrics and Gynecology

## 2021-12-25 ENCOUNTER — Encounter: Payer: Medicare Other | Admitting: Obstetrics and Gynecology

## 2021-12-25 ENCOUNTER — Ambulatory Visit (INDEPENDENT_AMBULATORY_CARE_PROVIDER_SITE_OTHER): Payer: Medicare Other | Admitting: Obstetrics and Gynecology

## 2021-12-25 VITALS — Ht 59.0 in | Wt 215.5 lb

## 2021-12-25 DIAGNOSIS — N951 Menopausal and female climacteric states: Secondary | ICD-10-CM

## 2021-12-25 NOTE — Progress Notes (Signed)
Patient not seen today as she did not need the visit.   Renee Shire, MD 12/25/21 10:24 AM

## 2021-12-25 NOTE — Progress Notes (Signed)
Patient referred for vaginal dryness due to menopause. Pt states, "I don't want this appointment." Patient had no further concerns or questions. Provider informed of patient decision. Patient advised to schedule as needed.  Patient verbalized understanding.    B'Aisha Zeriah Baysinger, CMA

## 2022-03-06 ENCOUNTER — Telehealth: Payer: Self-pay | Admitting: Family Medicine

## 2022-03-06 NOTE — Telephone Encounter (Signed)
Pt is needing a cpe, it looks to me she had one on 05/30/21. She does not remember having a physical. Could you please advise pt at [phone] or reach back out to me.

## 2022-03-07 NOTE — Telephone Encounter (Signed)
Looks like appointment on 05/30/21 was NP/establish care

## 2022-03-08 DIAGNOSIS — H5213 Myopia, bilateral: Secondary | ICD-10-CM | POA: Diagnosis not present

## 2022-03-11 ENCOUNTER — Encounter: Payer: Medicare Other | Admitting: Family Medicine

## 2022-04-10 ENCOUNTER — Encounter: Payer: Self-pay | Admitting: Nurse Practitioner

## 2022-04-10 ENCOUNTER — Ambulatory Visit (INDEPENDENT_AMBULATORY_CARE_PROVIDER_SITE_OTHER): Payer: Medicare Other | Admitting: Nurse Practitioner

## 2022-04-10 VITALS — BP 162/84 | HR 87 | Temp 99.9°F | Ht 59.0 in | Wt 212.6 lb

## 2022-04-10 DIAGNOSIS — R509 Fever, unspecified: Secondary | ICD-10-CM

## 2022-04-10 DIAGNOSIS — J4520 Mild intermittent asthma, uncomplicated: Secondary | ICD-10-CM | POA: Diagnosis not present

## 2022-04-10 DIAGNOSIS — U071 COVID-19: Secondary | ICD-10-CM | POA: Diagnosis not present

## 2022-04-10 LAB — POCT INFLUENZA A/B
Influenza A, POC: NEGATIVE
Influenza B, POC: NEGATIVE

## 2022-04-10 LAB — POC COVID19 BINAXNOW: SARS Coronavirus 2 Ag: POSITIVE — AB

## 2022-04-10 MED ORDER — NIRMATRELVIR/RITONAVIR (PAXLOVID)TABLET: 3.0000 | ORAL_TABLET | Freq: Two times a day (BID) | ORAL | 0 refills | Status: AC

## 2022-04-10 MED ORDER — ALBUTEROL SULFATE HFA 108 (90 BASE) MCG/ACT IN AERS: 2.0000 | INHALATION_SPRAY | RESPIRATORY_TRACT | 0 refills | Status: DC | PRN

## 2022-04-10 NOTE — Progress Notes (Signed)
Acute Office Visit  Subjective:     Patient ID: Renee Kerr, female    DOB: 03/18/1955, 67 y.o.   MRN: [id number]  Chief Complaint  Patient presents with   Sinusitis    Congestion, cough, oral pain, ears stopped     HPI Patient is in today for congestion, cough, and body aches for 1 day. She has a history of asthma and doesn't want this to go to her chest.   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     Objective:    BP (!) 162/84 (BP Location: Right Arm, Cuff Size: Large)   Pulse 87   Temp 99.9 F (37.7 C) (Oral)   Ht '4\' 11"'$  (1.499 m)   Wt 212 lb 9.6 oz (96.4 kg)   BMI 42.94 kg/m    Physical Exam Vitals and nursing note reviewed.  Constitutional:      General: She is not in acute distress.    Appearance: Normal appearance.  HENT:     Head: Normocephalic.     Right Ear: Tympanic membrane, ear canal and external ear normal.     Left Ear: Tympanic membrane, ear canal and external ear normal.     Nose:     Right Sinus: Maxillary sinus tenderness and frontal sinus tenderness present.     Left Sinus: Maxillary sinus tenderness and frontal sinus tenderness present.  Eyes:     Conjunctiva/sclera: Conjunctivae normal.  Cardiovascular:     Rate and Rhythm: Normal rate and  regular rhythm.     Pulses: Normal pulses.     Heart sounds: Normal heart sounds.  Pulmonary:     Effort: Pulmonary effort is normal.     Breath sounds: Normal breath sounds.  Musculoskeletal:     Cervical back: Normal range of motion and neck supple. No tenderness.  Lymphadenopathy:     Cervical: No cervical adenopathy.  Skin:    General: Skin is warm.  Neurological:     General: No focal deficit present.     Mental Status: She is alert and oriented to person, place, and time.  Psychiatric:        Mood and Affect: Mood normal.        Behavior: Behavior normal.        Thought Content: Thought content normal.        Judgment: Judgment normal.     Results for orders placed or performed in visit on 04/10/22  POC COVID-19 BinaxNow  Result Value Ref Range   SARS Coronavirus 2 Ag Positive (A) Negative  POCT Influenza A/B  Result Value Ref Range   Influenza A, POC Negative Negative   Influenza B, POC Negative Negative        Assessment & Plan:   Problem List Items Addressed This Visit       Other   COVID-19 - Primary    With  age and history, will treat with Paxlovid twice a day for 5 days.  Discussed possible side effects.  Will have her stop her Advair inhaler while taking the Paxlovid and use albuterol at least twice a day.  She can continue over-the-counter medications.  Work note given. Encourage rest and fluids.  Reviewed home care instructions for COVID. Advised self-isolation at home for at least 5 days. After 5 days, if improved and fever resolved, can be in public, but should wear a mask around others for an additional 5 days. If symptoms, esp, dyspnea develops/worsens, recommend in-person evaluation at either an urgent care or the emergency room.       Relevant Medications   nirmatrelvir/ritonavir (PAXLOVID) 20 x 150 MG & 10 x '100MG'$  TABS   Other Visit Diagnoses     Mild intermittent reactive airway disease without complication       Albuterol inhaler refill  sent to pharmacy.   Relevant Medications   albuterol (VENTOLIN HFA) 108 (90 Base) MCG/ACT inhaler   Fever, unspecified fever cause       POC covid positive. Flu negative. Alternate tylenol and ibuprofen as needed for fever.   Relevant Orders   POC COVID-19 BinaxNow (Completed)   POCT Influenza A/B (Completed)       Meds ordered this encounter  Medications   nirmatrelvir/ritonavir (PAXLOVID) 20 x 150 MG & 10 x '100MG'$  TABS    Sig: Take 3 tablets by mouth 2 (two) times daily for 5 days. (Take nirmatrelvir 150 mg two tablets twice daily for 5 days and ritonavir 100 mg one tablet twice daily for 5 days) Patient GFR is 80    Dispense:  30 tablet    Refill:  0   albuterol (VENTOLIN HFA) 108 (90 Base) MCG/ACT inhaler    Sig: Inhale 2 puffs into the lungs every 4 (four) hours as needed for wheezing or shortness of breath.    Dispense:  1 each    Refill:  0    Return if symptoms worsen or fail to improve.  Charyl Dancer, NP

## 2022-04-10 NOTE — Patient Instructions (Signed)
It was great to see you!  Stop the powder advair inhaler while taking the paxlovid.   Start paxlovid 3 capsules twice a day for covid.   Start albuterol inhaler twice a day instead of the advair just while taking paxlovid.   Drink plenty of fluids, get rest.   Isolate through Sunday, wear a mask through next Friday.   Let's follow-up if your symptoms worsen or don't improve.   Take care,  Vance Peper, NP

## 2022-04-10 NOTE — Assessment & Plan Note (Signed)
With age and history, will treat with Paxlovid twice a day for 5 days.  Discussed possible side effects.  Will have her stop her Advair inhaler while taking the Paxlovid and use albuterol at least twice a day.  She can continue over-the-counter medications.  Work note given. Encourage rest and fluids.  Reviewed home care instructions for COVID. Advised self-isolation at home for at least 5 days. After 5 days, if improved and fever resolved, can be in public, but should wear a mask around others for an additional 5 days. If symptoms, esp, dyspnea develops/worsens, recommend in-person evaluation at either an urgent care or the emergency room.

## 2022-04-11 ENCOUNTER — Ambulatory Visit: Payer: Medicare Other | Admitting: Family Medicine

## 2022-04-15 DIAGNOSIS — Z6841 Body Mass Index (BMI) 40.0 and over, adult: Secondary | ICD-10-CM | POA: Diagnosis not present

## 2022-04-15 DIAGNOSIS — R5383 Other fatigue: Secondary | ICD-10-CM | POA: Diagnosis not present

## 2022-04-28 NOTE — Progress Notes (Unsigned)
Shirlyn Goltz, PhD, LAT, ATC acting as a scribe for Lynne Leader, MD.  Renee Kerr is a 67 y.o. female who presents to [place] at [facility] today for knee pain patient was last seen by Dr. Georgina Snell on 08/09/2021 and was given a left knee steroid injection and advised to continue working on weight loss.  Today, patient reports flare up of BILAT knee pain, L>R. Denies swelling. Taking Advil prn. Denies mechanical or radicular sx. No new injury. Got about 3-4 months of relief after last inj.   Dx imaging: 07/04/21 L knee XR   Pertinent review of systems: No fevers or chills  Relevant historical information: Obese.  History of rickets as a child   Exam:  BP 138/84   Pulse 80   Ht 4\' 11"  (1.499 m)   Wt 213 lb 12.8 oz (97 kg)   SpO2 96%   BMI 43.18 kg/m  General: Well Developed, well nourished, and in no acute distress.   MSK: Knees bilaterally significant for large soft tissue mass on the medial aspect of the distal thigh.  Otherwise normal-appearing  Normal motion with crepitation.    Lab and Radiology Results  Procedure: Real-time Ultrasound Guided Injection of left joint knee superior lateral patellar space Device: Philips Affiniti 50G Images permanently stored and available for review in PACS Verbal informed consent obtained.  Discussed risks and benefits of procedure. Warned about infection, bleeding, hyperglycemia damage to structures among others. Patient expresses understanding and agreement Time-out conducted.   Noted no overlying erythema, induration, or other signs of local infection.   Skin prepped in a sterile fashion.   Local anesthesia: Topical Ethyl chloride.   With sterile technique and under real time ultrasound guidance: 40 mg of Kenalog and 2 mL Marcaine injected into knee joint. Fluid seen entering the joint capsule.   Completed without difficulty   Pain immediately resolved suggesting accurate placement of the medication.    Advised to call if fevers/chills, erythema, induration, drainage, or persistent bleeding.   Images permanently stored and available for review in the ultrasound unit.  Impression: Technically successful ultrasound guided injection. Spinal needle used   Procedure: Real-time Ultrasound Guided Injection of right joint knee superior lateral patellar space Device: Philips Affiniti 50G Images permanently stored and available for review in PACS Verbal informed consent obtained.  Discussed risks and benefits of procedure. Warned about infection, bleeding, hyperglycemia damage to structures among others. Patient expresses understanding and agreement Time-out conducted.   Noted no overlying erythema, induration, or other signs of local infection.   Skin prepped in a sterile fashion.   Local anesthesia: Topical Ethyl chloride.   With sterile technique and under real time ultrasound guidance: 40 mg of Kenalog and 2 mL Marcaine injected into joint capsule. Fluid seen entering the knee joint.   Completed without difficulty   Pain immediately resolved suggesting accurate placement of the medication.   Advised to call if fevers/chills, erythema, induration, drainage, or persistent bleeding.   Images permanently stored and available for review in the ultrasound unit.  Impression: Technically successful ultrasound guided injection.        Assessment and Plan: 67 y.o. female with bilateral knee pain due to DJD.  This is an acute exacerbation of chronic problem.  Plan for repeat steroid injection bilateral knees. Last shot lasted about 3 months.  Recommend recheck in my office at 3 or 4 months.  Anticipate that she will probably need a shot about that time.  She waited  way too long to return.  It has been 9 months since her last injection.   PDMP not reviewed this encounter. Orders Placed This Encounter  Procedures   [place] LIMITED JOINT SPACE STRUCTURES LOW BILAT(NO LINKED CHARGES)    Order Specific  Question:   Reason for Exam (SYMPTOM  OR DIAGNOSIS REQUIRED)    Answer:   bilat knee pain    Order Specific Question:   Preferred imaging location?    Answer:   [place]   No orders of the defined types were placed in this encounter.    Discussed warning signs or symptoms. Please see discharge instructions. Patient expresses understanding.   The above documentation has been reviewed and is accurate and complete Lynne Leader, M.D.

## 2022-04-29 ENCOUNTER — Encounter: Payer: Self-pay | Admitting: Family Medicine

## 2022-04-29 ENCOUNTER — Other Ambulatory Visit: Payer: Self-pay

## 2022-04-29 ENCOUNTER — Ambulatory Visit: Payer: Medicare Other | Admitting: Family Medicine

## 2022-04-29 VITALS — BP 138/84 | HR 80 | Ht 59.0 in | Wt 213.8 lb

## 2022-04-29 DIAGNOSIS — M25561 Pain in right knee: Secondary | ICD-10-CM

## 2022-04-29 DIAGNOSIS — M170 Bilateral primary osteoarthritis of knee: Secondary | ICD-10-CM

## 2022-04-29 DIAGNOSIS — M25562 Pain in left knee: Secondary | ICD-10-CM

## 2022-04-29 DIAGNOSIS — G8929 Other chronic pain: Secondary | ICD-10-CM

## 2022-04-29 DIAGNOSIS — M179 Osteoarthritis of knee, unspecified: Secondary | ICD-10-CM | POA: Insufficient documentation

## 2022-04-29 NOTE — Patient Instructions (Addendum)
Thank you for coming in today.   You received an injection today. Seek immediate medical attention if the joint becomes red, extremely painful, or is oozing fluid.   Check back in 3 months 

## 2022-05-06 ENCOUNTER — Encounter: Payer: Medicare Other | Admitting: Family Medicine

## 2022-05-07 ENCOUNTER — Other Ambulatory Visit: Payer: Self-pay | Admitting: Nurse Practitioner

## 2022-05-07 DIAGNOSIS — J4520 Mild intermittent asthma, uncomplicated: Secondary | ICD-10-CM

## 2022-05-23 ENCOUNTER — Encounter: Payer: Self-pay | Admitting: Internal Medicine

## 2022-06-04 ENCOUNTER — Encounter: Payer: Medicare Other | Admitting: Family Medicine

## 2022-06-13 DIAGNOSIS — N390 Urinary tract infection, site not specified: Secondary | ICD-10-CM | POA: Diagnosis not present

## 2022-06-16 ENCOUNTER — Ambulatory Visit: Payer: Medicare Other | Admitting: Obstetrics and Gynecology

## 2022-06-18 ENCOUNTER — Other Ambulatory Visit: Payer: Self-pay

## 2022-06-18 ENCOUNTER — Telehealth: Payer: Self-pay | Admitting: Family Medicine

## 2022-06-18 ENCOUNTER — Encounter (HOSPITAL_BASED_OUTPATIENT_CLINIC_OR_DEPARTMENT_OTHER): Payer: Self-pay

## 2022-06-18 ENCOUNTER — Emergency Department (HOSPITAL_BASED_OUTPATIENT_CLINIC_OR_DEPARTMENT_OTHER): Payer: Medicare Other

## 2022-06-18 ENCOUNTER — Emergency Department (HOSPITAL_BASED_OUTPATIENT_CLINIC_OR_DEPARTMENT_OTHER)
Admission: EM | Admit: 2022-06-18 | Discharge: 2022-06-18 | Disposition: A | Discharge status: Home / Self Care | Payer: Medicare Other | Attending: Emergency Medicine | Admitting: Emergency Medicine

## 2022-06-18 DIAGNOSIS — J45909 Unspecified asthma, uncomplicated: Secondary | ICD-10-CM | POA: Insufficient documentation

## 2022-06-18 DIAGNOSIS — R1032 Left lower quadrant pain: Secondary | ICD-10-CM | POA: Insufficient documentation

## 2022-06-18 DIAGNOSIS — D649 Anemia, unspecified: Secondary | ICD-10-CM | POA: Insufficient documentation

## 2022-06-18 DIAGNOSIS — N950 Postmenopausal bleeding: Secondary | ICD-10-CM

## 2022-06-18 DIAGNOSIS — R109 Unspecified abdominal pain: Secondary | ICD-10-CM | POA: Diagnosis not present

## 2022-06-18 DIAGNOSIS — R1030 Lower abdominal pain, unspecified: Secondary | ICD-10-CM

## 2022-06-18 DIAGNOSIS — Z7951 Long term (current) use of inhaled steroids: Secondary | ICD-10-CM | POA: Diagnosis not present

## 2022-06-18 DIAGNOSIS — R1031 Right lower quadrant pain: Secondary | ICD-10-CM | POA: Diagnosis not present

## 2022-06-18 DIAGNOSIS — N939 Abnormal uterine and vaginal bleeding, unspecified: Secondary | ICD-10-CM | POA: Diagnosis not present

## 2022-06-18 LAB — COMPREHENSIVE METABOLIC PANEL
ALT: 14 U/L (ref 0–44)
AST: 19 U/L (ref 15–41)
Albumin: 3.5 g/dL (ref 3.5–5.0)
Alkaline Phosphatase: 58 U/L (ref 38–126)
Anion gap: 10 (ref 5–15)
BUN: 13 mg/dL (ref 8–23)
CO2: 22 mmol/L (ref 22–32)
Calcium: 8.5 mg/dL — ABNORMAL LOW (ref 8.9–10.3)
Chloride: 108 mmol/L (ref 98–111)
Creatinine, Ser: 0.86 mg/dL (ref 0.44–1.00)
GFR, Estimated: >60 mL/min (ref >60)
Glucose, Bld: 96 mg/dL (ref 70–99)
Potassium: 3.6 mmol/L (ref 3.5–5.1)
Sodium: 140 mmol/L (ref 135–145)
Total Bilirubin: 0.3 mg/dL (ref 0.3–1.2)
Total Protein: 7.4 g/dL (ref 6.5–8.1)

## 2022-06-18 LAB — URINALYSIS, W/ REFLEX TO CULTURE (INFECTION SUSPECTED)
Bacteria, UA: NONE SEEN
Bilirubin Urine: NEGATIVE
Glucose, UA: NEGATIVE mg/dL
Ketones, ur: NEGATIVE mg/dL
Nitrite: NEGATIVE
Protein, ur: NEGATIVE mg/dL
Specific Gravity, Urine: 1.013 (ref 1.005–1.030)
pH: 5.5 (ref 5.0–8.0)

## 2022-06-18 LAB — CBC WITH DIFFERENTIAL/PLATELET
Abs Immature Granulocytes: 0.04 10*3/uL (ref 0.00–0.07)
Basophils Absolute: 0 10*3/uL (ref 0.0–0.1)
Basophils Relative: 0 %
Eosinophils Absolute: 0.1 10*3/uL (ref 0.0–0.5)
Eosinophils Relative: 1 %
HCT: 37.2 % (ref 36.0–46.0)
Hemoglobin: 11.9 g/dL — ABNORMAL LOW (ref 12.0–15.0)
Immature Granulocytes: 0 %
Lymphocytes Relative: 25 %
Lymphs Abs: 2.3 10*3/uL (ref 0.7–4.0)
MCH: 26.1 pg (ref 26.0–34.0)
MCHC: 32 g/dL (ref 30.0–36.0)
MCV: 81.6 fL (ref 80.0–100.0)
Monocytes Absolute: 0.8 10*3/uL (ref 0.1–1.0)
Monocytes Relative: 8 %
Neutro Abs: 5.8 10*3/uL (ref 1.7–7.7)
Neutrophils Relative %: 66 %
Platelets: 295 10*3/uL (ref 150–400)
RBC: 4.56 MIL/uL (ref 3.87–5.11)
RDW: 15.5 % (ref 11.5–15.5)
WBC: 9 10*3/uL (ref 4.0–10.5)
nRBC: 0 % (ref 0.0–0.2)

## 2022-06-18 LAB — LIPASE, BLOOD: Lipase: 25 U/L (ref 11–51)

## 2022-06-18 MED ORDER — IOHEXOL 300 MG/ML  SOLN
100.0000 mL | Freq: Once | INTRAMUSCULAR | Status: AC | PRN
  Administered 2022-06-18: 100 mL via INTRAVENOUS

## 2022-06-18 NOTE — ED Provider Notes (Signed)
[place] EMERGENCY DEPARTMENT AT [facility] Provider Note   CSN: [id number] Arrival date & time: 06/18/22  1606     History {Add pertinent medical, surgical, social history, OB history to HPI:1} Chief Complaint  Patient presents with   Abdominal Pain   Vaginal Bleeding    Danniela Pallanes is a 67 y.o. female.  HPI      67 year old female with a history of asthma, GERD, who presents with concern for vaginal bleeding and abdominal pain.    Last week having some blood on underwear, improved, then came back. Went to urgent care and saw some WBC in urine, treated her for UTI. Gave abx for that.  Today pain worse, took tylenol at 219PM, called dr and then night nurse who said she should come to the ED.  Lower abdomen, in middle, pressure, felt like uterus coming out, severe pain, was driving, every bump hit made it worse. Didn't realize really bleeding until got to the school to go to the bathroom.  Had been wearing a pad..  Had some bleeding about a month ago and stopped then it.  Didn't have any GYN appointments available.  Tried to go to [facility]  Pain has been 2 weeks off and on.  Taking tylenol helps No nausea, vomiting, fever, diarrhea, constipation, dysuria      Past Medical History:  Diagnosis Date   Asthma    Chest pain    Heart palpitations    Joint pain, knee    bilateral   Obesity      Home Medications Prior to Admission medications   Medication Sig Start Date End Date Taking? Authorizing Provider  acetaminophen (TYLENOL) 500 MG tablet Take 1 tablet (500 mg total) by mouth every 6 (six) hours as needed. 04/16/21   Derwood Kaplan, MD  albuterol (VENTOLIN HFA) 108 (90 Base) MCG/ACT inhaler INHALE 2 PUFFS INTO THE LUNGS EVERY 4 HOURS AS NEEDED FOR WHEEZING OR SHORTNESS OF BREATH. 05/07/22   Mliss Sax, MD  azelastine (ASTELIN) 0.1 % nasal spray Place 1 spray into both nostrils 2 (two) times daily. Use in each nostril as directed 07/11/21    Mliss Sax, MD  benzonatate (TESSALON) 200 MG capsule Take 1 capsule (200 mg total) by mouth 2 (two) times daily as needed for cough. 07/05/21   Mliss Sax, MD  fluticasone Aleda Grana) 50 MCG/ACT nasal spray     [provider]  fluticasone-salmeterol (ADVAIR DISKUS) 250-50 MCG/ACT AEPB Inhale 1 puff into the lungs in the morning and at bedtime. 07/11/21   Mliss Sax, MD  hydrocortisone (ANUSOL-HC) 2.5 % rectal cream Place 1 Application rectally 2 (two) times daily. 10/15/21   Imogene Burn, MD  ibuprofen (ADVIL,MOTRIN) 200 MG tablet Take 200 mg by mouth every 6 (six) hours as needed for mild pain.    [provider]  Olopatadine HCl (PATADAY) 0.2 % SOLN Use twice a day for eye irritation 11/12/14   Elson Areas, PA-C  omeprazole (PRILOSEC) 40 MG capsule TAKE 1 CAPSULE (40 MG TOTAL) BY MOUTH DAILY. 08/26/21   Mliss Sax, MD  Vitamin D, Ergocalciferol, (DRISDOL) 50000 UNITS CAPS Take 50,000 Units by mouth every 7 (seven) days. Take on saturdays    [provider]      Allergies    Wound dressing adhesive and Tramadol    Review of Systems   Review of Systems  Physical Exam Updated Vital Signs BP (!) 145/80   Pulse 95   Temp  98.6 F (37 C)   Resp 16   SpO2 98%  Physical Exam  ED Results / Procedures / Treatments   Labs (all labs ordered are listed, but only abnormal results are displayed) Labs Reviewed - No data to display  EKG None  Radiology No results found.  Procedures Procedures  {Document cardiac monitor, telemetry assessment procedure when appropriate:1}  Medications Ordered in ED Medications - No data to display  ED Course/ Medical Decision Making/ A&P   {   Click here for ABCD2, HEART and other calculatorsREFRESH Note before signing :1}                          Medical Decision Making Amount and/or Complexity of Data Reviewed Labs: ordered.   ***  {Document critical care time when  appropriate:1} {Document review of labs and clinical decision tools ie heart score, Chads2Vasc2 etc:1}  {Document your independent review of radiology images, and any outside records:1} {Document your discussion with family members, caretakers, and with consultants:1} {Document social determinants of health affecting pt's care:1} {Document your decision making why or why not admission, treatments were needed:1} Final Clinical Impression(s) / ED Diagnoses Final diagnoses:  None    Rx / DC Orders ED Discharge Orders     None

## 2022-06-18 NOTE — ED Notes (Signed)
Provider at bedside

## 2022-06-18 NOTE — ED Notes (Signed)
Ambulatory to restroom

## 2022-06-18 NOTE — Telephone Encounter (Signed)
Caller Name: Renee Kerr Ph #: [phone] Chief Complaint: Severe abdominal pain lasting a few days.   This call was transferred to Nurse Triage/Access Nurse. This is for documentation purposes. No follow up required at this time.

## 2022-06-18 NOTE — ED Triage Notes (Signed)
Pt c/o "bleeding from vagina" & lower abd cramping since last week, "it was just a stain & then it went away; yesterday it was brown & today it was reddish-brown."   Tylenol approx 2p, "it helped a little bit but it's still there."  UC Friday, UA revealed WBC in urine, sent home w abx & "of course that's not working."

## 2022-06-23 ENCOUNTER — Encounter: Payer: Self-pay | Admitting: Family Medicine

## 2022-06-23 ENCOUNTER — Ambulatory Visit (INDEPENDENT_AMBULATORY_CARE_PROVIDER_SITE_OTHER): Payer: Medicare Other | Admitting: Family Medicine

## 2022-06-23 ENCOUNTER — Ambulatory Visit: Payer: Medicare Other | Admitting: Obstetrics and Gynecology

## 2022-06-23 ENCOUNTER — Other Ambulatory Visit: Payer: Self-pay

## 2022-06-23 VITALS — BP 122/70 | HR 70 | Temp 97.6°F | Ht 59.0 in | Wt 209.8 lb

## 2022-06-23 VITALS — BP 122/80 | HR 69 | Wt 210.1 lb

## 2022-06-23 DIAGNOSIS — D649 Anemia, unspecified: Secondary | ICD-10-CM | POA: Diagnosis not present

## 2022-06-23 DIAGNOSIS — B353 Tinea pedis: Secondary | ICD-10-CM

## 2022-06-23 DIAGNOSIS — R7303 Prediabetes: Secondary | ICD-10-CM

## 2022-06-23 DIAGNOSIS — M722 Plantar fascial fibromatosis: Secondary | ICD-10-CM | POA: Diagnosis not present

## 2022-06-23 DIAGNOSIS — E559 Vitamin D deficiency, unspecified: Secondary | ICD-10-CM | POA: Diagnosis not present

## 2022-06-23 DIAGNOSIS — N938 Other specified abnormal uterine and vaginal bleeding: Secondary | ICD-10-CM | POA: Insufficient documentation

## 2022-06-23 DIAGNOSIS — Z0000 Encounter for general adult medical examination without abnormal findings: Secondary | ICD-10-CM

## 2022-06-23 DIAGNOSIS — N939 Abnormal uterine and vaginal bleeding, unspecified: Secondary | ICD-10-CM | POA: Diagnosis not present

## 2022-06-23 DIAGNOSIS — Z1159 Encounter for screening for other viral diseases: Secondary | ICD-10-CM | POA: Diagnosis not present

## 2022-06-23 DIAGNOSIS — Z0001 Encounter for general adult medical examination with abnormal findings: Secondary | ICD-10-CM | POA: Diagnosis not present

## 2022-06-23 LAB — LIPID PANEL
Cholesterol: 123 mg/dL (ref 0–200)
HDL: 57.6 mg/dL (ref >39.00)
LDL Cholesterol: 53 mg/dL (ref 0–99)
NonHDL: 65.38
Total CHOL/HDL Ratio: 2
Triglycerides: 64 mg/dL (ref 0.0–149.0)
VLDL: 12.8 mg/dL (ref 0.0–40.0)

## 2022-06-23 LAB — B12 AND FOLATE PANEL
Folate: 10.8 ng/mL (ref >5.9)
Vitamin B-12: 426 pg/mL (ref 211–911)

## 2022-06-23 LAB — TSH: TSH: 1.37 u[IU]/mL (ref 0.35–5.50)

## 2022-06-23 LAB — VITAMIN D 25 HYDROXY (VIT D DEFICIENCY, FRACTURES): VITD: 50.82 ng/mL (ref 30.00–100.00)

## 2022-06-23 LAB — HEMOGLOBIN A1C: Hgb A1c MFr Bld: 6.2 % (ref 4.6–6.5)

## 2022-06-23 MED ORDER — VITAMIN D (ERGOCALCIFEROL) 1.25 MG (50000 UNIT) PO CAPS
50000.0000 [IU] | ORAL_CAPSULE | ORAL | 1 refills | Status: AC
Start: 2022-06-23 — End: ?

## 2022-06-23 MED ORDER — KETOCONAZOLE 2 % EX CREA
1.0000 | TOPICAL_CREAM | Freq: Every day | CUTANEOUS | 0 refills | Status: AC
Start: 2022-06-23 — End: ?

## 2022-06-23 NOTE — Progress Notes (Signed)
GYNECOLOGY VISIT  Patient name: Renee Kerr MRN [id number]  Date of birth: 1955/09/26 Chief Complaint:   POSTMENAPAUSAL BLEEDING    History:  Renee Kerr is a 67 y.o. No obstetric history on file. being seen today for PMB, intermittent for 3 weeks. Sudden onset heavy bleeding and pain in her pelviS and felt like something is coming out of her. 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   Past Medical History:  Diagnosis Date   Asthma    Chest pain    Heart palpitations    Joint pain, knee    bilateral   Obesity     Past Surgical History:  Procedure Laterality Date   CESAREAN SECTION  02/11/1984   x2 with both children   COLONOSCOPY WITH ESOPHAGOGASTRODUODENOSCOPY (EGD)  2013    The following portions of the patient's history were reviewed and updated as appropriate: allergies, current medications, past family history, past medical history, past social history, past surgical history and problem list.   Health Maintenance:   Last pap 06/2012. nilm Last mammogram: 08/2021 BIRADS 2   Review of Systems:  Pertinent items are noted in HPI. Comprehensive review of systems was otherwise negative.   Objective:  Physical Exam BP 122/80   Pulse 69   Wt 210 lb 1.6 oz (95.3 kg)   BMI 42.44 kg/m    Physical Exam Vitals and nursing note reviewed. Exam conducted with a chaperone present.  Constitutional:      Appearance: Normal appearance.  HENT:     Head: Normocephalic and atraumatic.  Pulmonary:     Effort: Pulmonary effort is normal.     Breath sounds: Normal breath sounds.  Genitourinary:    General: Normal vulva.     Exam position: Lithotomy position.     Vagina:  Normal.     Cervix: Normal.     Comments: Small atrophic cervix with stenotic cervix  Skin:    General: Skin is warm and dry.  Neurological:     General: No focal deficit present.     Mental Status: She is alert.  Psychiatric:        Mood and Affect: Mood normal.        Behavior: Behavior normal.        Thought Content: Thought content normal.        Judgment: Judgment normal.      Labs and Imaging CT ABDOMEN PELVIS W CONTRAST  Result Date: 06/18/2022 CLINICAL DATA:  RLQ abdominal pain Patient reports vaginal bleeding. EXAM: CT ABDOMEN AND PELVIS WITH CONTRAST TECHNIQUE: Multidetector CT imaging of the abdomen and pelvis was performed using the standard protocol following bolus administration of intravenous contrast. RADIATION DOSE REDUCTION: This exam was performed according to the departmental dose-optimization program which includes automated exposure control, adjustment of the mA and/or kV according to patient size and/or use of iterative reconstruction technique. CONTRAST:  OMNIPAQUE IOHEXOL 300 MG/ML  SOLN COMPARISON:  Remote CT 06/03/2012 FINDINGS: Lower chest: Clear lung bases. Hepatobiliary: The liver is enlarged spanning 18.6 cm cranial caudal. No focal liver abnormality. Gallbladder physiologically distended, no calcified stone. No biliary dilatation. Pancreas: No ductal dilatation  or inflammation. Spleen: Normal in size without focal abnormality. Adrenals/Urinary Tract: Normal adrenal glands. No hydronephrosis or perinephric edema. Homogeneous renal enhancement with symmetric excretion on delayed phase imaging. No renal calculi or focal renal abnormality. Urinary bladder is physiologically distended without wall thickening. Stomach/Bowel: Small hiatal hernia. There is no bowel obstruction or inflammation. Minimal sigmoid colonic diverticulosis without diverticulitis. No appendicitis. Vascular/Lymphatic: Normal caliber abdominal aorta. Patent portal vein. No enlarged lymph nodes  in the abdomen or pelvis Reproductive: The uterus is deviated into the right pelvis. Endometrial thickening versus fluid in the endometrial canal measuring 10 mm in the fundus and 12 mm in the lower uterine segment. Right ovary is not well seen. The left ovary is quiescent. Other: No ascites. No focal fluid collection. No abdominal wall hernia. Musculoskeletal: Lower lumbar facet hypertrophy. Mild multilevel spondylosis. There is bilateral hip degenerative change. There are no acute or suspicious osseous abnormalities. IMPRESSION: 1. Endometrial thickening versus fluid in the endometrial canal measuring 10 mm in the fundus and 12 mm in the lower uterine segment. This is abnormal in a postmenopausal patient, particularly in the setting of postmenopausal vaginal bleeding. Recommend pelvic ultrasound for accurate endometrial characterization. 2. Minimal sigmoid colonic diverticulosis without diverticulitis. 3. Small hiatal hernia. Electronically Signed   By: Narda Rutherford M.D.   On: 06/18/2022 20:44        Endometrial Biopsy Procedure  Patient identified, informed consent performed,  indication reviewed, consent signed.  Reviewed risk of perforation, pain, bleeding, insufficient sample, etc were reviewd. Time out was performed.  Urine pregnancy test negative.  Speculum placed in the vagina.  Cervix visualized.  Cleaned with Betadine x 2.   Grasped anteriorly with a single tooth tenaculum.  Paracervical block was not administered. The cervix was grasped with a tenaculum but unable to pass pipelle due to stenosis and patient discomfort limiting visualization.  Patient tolerated procedure well.  Patient was given post-procedure instructions.  Assessment & Plan:   1. Abnormal uterine bleeding (AUB) Attempted EMB but aborted due to poor visualization, patient discomfort and cervical stenosis. Will order pelvic US to assess endometrial lining and possible hysteroscopy.  - Surgical pathology - US PELVIC  COMPLETE WITH TRANSVAGINAL; Future    Lorriane Shire, MD [facility] for [facility], [facility]

## 2022-06-23 NOTE — Progress Notes (Signed)
Established Patient Office Visit   Subjective:  Patient ID: Renee Kerr, female    DOB: 1955-04-01  Age: 67 y.o. MRN: [id number]  Chief Complaint  Patient presents with   Annual Exam    CPE, concerns about left leg pains that will not go away. Check rash on both feet. Patient fasting.     HPI Encounter Diagnoses  Name Primary?   DUB (dysfunctional uterine bleeding) Yes   Vitamin D deficiency    Healthcare maintenance    Plantar fasciitis    Tinea pedis of both feet    Anemia, unspecified type    Prediabetes    Encounter for hepatitis C screening test for low risk patient    Pertinent physical exam today.  Seen in emergency room last week for DU B.  She complains of left foot pain in the heel area.  Is worse after resting.  She has a rash on both feet she has been treating with triamcinolone ointment.  History of prediabetes.  Difficult for her to exercise secondary to pain and foot.  She does have regular dental care.   Review of Systems  Constitutional: Negative.   HENT: Negative.    Eyes:  Negative for blurred vision, discharge and redness.  Respiratory: Negative.    Cardiovascular: Negative.   Gastrointestinal:  Negative for abdominal pain.  Genitourinary: Negative.   Musculoskeletal:  Positive for joint pain. Negative for myalgias.  Skin:  Positive for rash.  Neurological:  Negative for tingling, loss of consciousness and weakness.  Endo/Heme/Allergies:  Negative for polydipsia.      06/23/2022   10:59 AM 06/23/2022   10:22 AM 06/23/2022   10:19 AM  Depression screen PHQ 2/9  Decreased Interest 0 0 0  Down, Depressed, Hopeless 0 0 0  PHQ - 2 Score 0 0 0  Altered sleeping 0    Tired, decreased energy 0    Change in appetite 0    Feeling bad or failure about yourself  0    Trouble concentrating 0    Moving slowly or fidgety/restless 0    Suicidal thoughts 0    PHQ-9 Score 0    Difficult doing work/chores Not difficult at all        Current Outpatient  Medications:    acetaminophen (TYLENOL) 500 MG tablet, Take 1 tablet (500 mg total) by mouth every 6 (six) hours as needed., Disp: 30 tablet, Rfl: 0   albuterol (VENTOLIN HFA) 108 (90 Base) MCG/ACT inhaler, INHALE 2 PUFFS INTO THE LUNGS EVERY 4 HOURS AS NEEDED FOR WHEEZING OR SHORTNESS OF BREATH., Disp: 18 each, Rfl: 2   azelastine (ASTELIN) 0.1 % nasal spray, Place 1 spray into both nostrils 2 (two) times daily. Use in each nostril as directed, Disp: 30 mL, Rfl: 12   fluticasone (FLONASE) 50 MCG/ACT nasal spray, , Disp: , Rfl:    fluticasone-salmeterol (ADVAIR DISKUS) 250-50 MCG/ACT AEPB, Inhale 1 puff into the lungs in the morning and at bedtime., Disp: 1 each, Rfl: 5   ibuprofen (ADVIL,MOTRIN) 200 MG tablet, Take 200 mg by mouth every 6 (six) hours as needed for mild pain., Disp: , Rfl:    ketoconazole (NIZORAL) 2 % cream, Apply 1 Application topically daily. For 14 days., Disp: 60 g, Rfl: 0   omeprazole (PRILOSEC) 40 MG capsule, TAKE 1 CAPSULE (40 MG TOTAL) BY MOUTH DAILY., Disp: 90 capsule, Rfl: 1   benzonatate (TESSALON) 200 MG capsule, Take 1 capsule (200 mg total) by mouth 2 (two) times daily  as needed for cough. (Patient not taking: Reported on 06/23/2022), Disp: 20 capsule, Rfl: 0   Olopatadine HCl (PATADAY) 0.2 % SOLN, Use twice a day for eye irritation (Patient not taking: Reported on 06/23/2022), Disp: 2.5 mL, Rfl: 0   Vitamin D, Ergocalciferol, (DRISDOL) 1.25 MG (50000 UNIT) CAPS capsule, Take 1 capsule (50,000 Units total) by mouth every 7 (seven) days. Take on saturdays, Disp: 12 capsule, Rfl: 1   Objective:     BP 122/70 (BP Location: Left Arm, Patient Position: Sitting, Cuff Size: Large)   Pulse 70   Temp 97.6 F (36.4 C) (Temporal)   Ht 4\' 11"  (1.499 m)   Wt 209 lb 12.8 oz (95.2 kg)   SpO2 94%   BMI 42.37 kg/m    Physical Exam Constitutional:      General: She is not in acute distress.    Appearance: Normal appearance. She is not ill-appearing, toxic-appearing or  diaphoretic.  HENT:     Head: Normocephalic and atraumatic.     Right Ear: External ear normal.     Left Ear: External ear normal.     Mouth/Throat:     Mouth: Mucous membranes are moist.     Pharynx: Oropharynx is clear. No oropharyngeal exudate or posterior oropharyngeal erythema.  Eyes:     General: No scleral icterus.       Right eye: No discharge.        Left eye: No discharge.     Extraocular Movements: Extraocular movements intact.     Conjunctiva/sclera: Conjunctivae normal.     Pupils: Pupils are equal, round, and reactive to light.  Cardiovascular:     Rate and Rhythm: Normal rate and regular rhythm.     Pulses:          Dorsalis pedis pulses are 2+ on the right side and 2+ on the left side.       Posterior tibial pulses are 2+ on the right side and 2+ on the left side.  Pulmonary:     Effort: Pulmonary effort is normal. No respiratory distress.     Breath sounds: Normal breath sounds.  Abdominal:     General: Bowel sounds are normal.     Tenderness: There is no abdominal tenderness. There is no guarding.  Musculoskeletal:     Cervical back: No rigidity or tenderness.     Comments: Feet are pes planus bilaterally.  Tenderness to palpation medial distal calcaneus.  Negative squeeze test.  Skin:    General: Skin is warm and dry.     Comments: Fine scaling rash in moccasin like distribution on both feet.  Neurological:     Mental Status: She is alert and oriented to person, place, and time.  Psychiatric:        Mood and Affect: Mood normal.        Behavior: Behavior normal.      No results found for any visits on 06/23/22.    The ASCVD Risk score (Arnett DK, et al., 2019) failed to calculate for the following reasons:   The valid total cholesterol range is 130 to 320 mg/dL    Assessment & Plan:   DUB (dysfunctional uterine bleeding)  Vitamin D deficiency -     Vitamin D (Ergocalciferol); Take 1 capsule (50,000 Units total) by mouth every 7 (seven) days.  Take on saturdays  Dispense: 12 capsule; Refill: 1 -     VITAMIN D 25 Hydroxy (Vit-D Deficiency, Fractures)  Healthcare maintenance -  Lipid panel  Plantar fasciitis -     Ambulatory referral to Sports Medicine  Tinea pedis of both feet -     Ketoconazole; Apply 1 Application topically daily. For 14 days.  Dispense: 60 g; Refill: 0  Anemia, unspecified type -     B12 and Folate Panel -     Iron, TIBC and Ferritin Panel -     TSH  Prediabetes -     Hemoglobin A1c  Encounter for hepatitis C screening test for low risk patient -     Hepatitis C antibody    Return in about 4 weeks (around 07/21/2022).  Follow-up with GYN as this afternoon.  Information was given on plantar fasciitis.  Discussed stretching in the morning time after she arises or after sitting for period of time.  Consider changing shoes with good arch support and heel and toe cushioning.  Sports medicine referral for possible inserts.  Nizoral cream for tinea pedis.  Information was given on tinea pedis and plantar fasciitis.  Information given on health maintenance and disease prevention.  Rechecking hemoglobin A1c.  Checking iron and B12 levels for normocytic anemia.  Mliss Sax, MD

## 2022-06-24 LAB — HEPATITIS C ANTIBODY: Hepatitis C Ab: NONREACTIVE

## 2022-06-24 LAB — IRON,TIBC AND FERRITIN PANEL
%SAT: 25 % (calc) (ref 16–45)
Ferritin: 144 ng/mL (ref 16–288)
Iron: 72 ug/dL (ref 45–160)
TIBC: 290 mcg/dL (calc) (ref 250–450)

## 2022-06-30 ENCOUNTER — Telehealth: Payer: Self-pay

## 2022-06-30 ENCOUNTER — Ambulatory Visit: Payer: Medicare Other | Admitting: Family Medicine

## 2022-06-30 VITALS — BP 144/84 | HR 68 | Ht 59.0 in | Wt 212.0 lb

## 2022-06-30 DIAGNOSIS — M722 Plantar fascial fibromatosis: Secondary | ICD-10-CM | POA: Diagnosis not present

## 2022-06-30 DIAGNOSIS — M170 Bilateral primary osteoarthritis of knee: Secondary | ICD-10-CM

## 2022-06-30 NOTE — Patient Instructions (Addendum)
Thank you for coming in today.   Ice massage.  Heel cushion  Please complete the exercises that the athletic trainer went over with you:  View at [email or web address] using code: [email or web address]  Night Splint for plantar fasciitis.   Ok to use a cam walker boot if you need to.   If you are getting worse I can do a cortisone shot if you need me to.   We will work to authorize next step shots for you left knee.

## 2022-06-30 NOTE — Progress Notes (Signed)
   Renee Payor, PhD, LAT, ATC acting as a scribe for Renee Graham, MD.  Renee Kerr is a 67 y.o. female who presents to [employer] [facility] at [facility] today for foot pain. Pt was previously seen by Dr. Denyse Kerr on 04/29/22 for bilat knee pain.  Today, pt c/o L foot pain x 1 wk. She relates the pain starting about the time she was trying to do an exercise video. Pt locates pain to the plantar aspect of the L calcaneous, also medial and lateral aspect and into the L Achilles tendon. She has shoe insoles that she just bought from [employer] store that are comfortable.  Aggravates: walking, worse 1st thing in the morning Treatments tried: Tylenol, IBU  Pertinent review of systems: No fevers or chills  Relevant historical information: History of knee arthritis and knee pain left worse than right.   Exam:  BP (!) 144/84   Pulse 68   Ht 4\' 11"  (1.499 m)   Wt 212 lb (96.2 kg)   SpO2 95%   BMI 42.82 kg/m  General: Well Developed, well nourished, and in no acute distress.   MSK: Left foot normal appearing Tender palpation plantar fascia.  Minimally tender palpation posterior calcaneus at Achilles tendon insertion.  Normal foot ankle motion.  Intact strength.     Assessment and Plan: 67 y.o. female with left foot pain thought to be due primarily to plantar fasciitis.  She may have a component of Achilles tendinitis from limping as well.  Plan for conservative management.  She already is using good cushioned insoles which are helpful.  Will add ice massage eccentric exercises and a night splint.  If not better we can consider injection.  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  She notes her left knee is starting to bother her again.  She has DJD left knee and had steroid injection relatively recently.  Will work on authorization for [employer] and hyaluronic acid injections which we can  proceed when approved.    Discussed warning signs or symptoms. Please see discharge instructions. Patient expresses understanding.   The above documentation has been reviewed and is accurate and complete Renee Kerr, M.D.

## 2022-06-30 NOTE — Telephone Encounter (Signed)
Rodolph Bong, MD  Dierdre Searles, CMA Please Mason Jim and gel shots left knee. Zilretta preferred

## 2022-06-30 NOTE — Telephone Encounter (Signed)
Corey, Renee S, MD  Dietra Stokely Kerr, CMA Please auth Zilretta and gel shots left knee. Zilretta preferred 

## 2022-07-01 ENCOUNTER — Ambulatory Visit: Payer: Medicare Other | Admitting: Obstetrics and Gynecology

## 2022-07-02 ENCOUNTER — Ambulatory Visit (HOSPITAL_COMMUNITY)
Admission: RE | Admit: 2022-07-02 | Discharge: 2022-07-02 | Disposition: A | Discharge status: Home / Self Care | Payer: Medicare Other | Source: Ambulatory Visit | Attending: Obstetrics and Gynecology | Admitting: Obstetrics and Gynecology

## 2022-07-02 DIAGNOSIS — N939 Abnormal uterine and vaginal bleeding, unspecified: Secondary | ICD-10-CM

## 2022-07-02 DIAGNOSIS — N950 Postmenopausal bleeding: Secondary | ICD-10-CM | POA: Diagnosis not present

## 2022-07-03 ENCOUNTER — Telehealth: Payer: Self-pay | Admitting: Family Medicine

## 2022-07-03 ENCOUNTER — Ambulatory Visit: Payer: Medicare Other | Admitting: Family Medicine

## 2022-07-03 NOTE — Telephone Encounter (Signed)
VOB initiated for Orthovisc for LEFT knee OA.  

## 2022-07-03 NOTE — Telephone Encounter (Signed)
Renee Kerr [phone]   Pt is very anxious, nervous for the results of her US done on 5/22. Please call as soon as you receive this.

## 2022-07-03 NOTE — Telephone Encounter (Signed)
VOB initiated for Zilretta for LEFT knee OA 

## 2022-07-03 NOTE — Telephone Encounter (Signed)
Patient calling to go over U/S results patient verbally understood results are not completed as of now but someone will give her a call as soon as they are in.

## 2022-07-03 NOTE — Telephone Encounter (Signed)
Patient aware and will call GYN for results

## 2022-07-04 ENCOUNTER — Encounter: Payer: Self-pay | Admitting: Obstetrics and Gynecology

## 2022-07-04 ENCOUNTER — Telehealth: Payer: Self-pay

## 2022-07-04 NOTE — Telephone Encounter (Signed)
Spoke with patient who would like to proceed with the procedure but has some questions and would like to make an appointment beforehand to discuss any concerns and questions such as how long the actual procedure would take and if she would be put under any anesthesia. Informed patient that someone from our front office will call her with an appointment with Dr.Ajewole. Patient expressed understanding.

## 2022-07-04 NOTE — Telephone Encounter (Signed)
Waiting on Orthovisc. 

## 2022-07-04 NOTE — Telephone Encounter (Signed)
-----   Message from Lorriane Shire, MD sent at 07/03/2022  3:51 PM EDT ----- US shows thick endometrial lining, recommend hysteroscopy D&C in the operating room for sampling to understand the cause of bleeding

## 2022-07-04 NOTE — Telephone Encounter (Signed)
Zilretta for LEFT knee OA (Also checking Orthovisc)  Primary Insurance: [employer] [place] Medicare Adv Co-Pay: $15 Co-Insurance: 20% Deductible: does not apply  Prior Auth: NOT required

## 2022-07-08 NOTE — Telephone Encounter (Addendum)
Orthovisc for LEFT knee OA (Also checked Zilretta)  Primary Insurance: [employer]BS [place] Co-Pay: $15 Co-Insurance: 20% Deductible: does not apply Prior Auth: NOT required   Last inj KENALOG 04/29/22

## 2022-07-08 NOTE — Telephone Encounter (Signed)
Scheduled

## 2022-07-10 ENCOUNTER — Encounter: Payer: Self-pay | Admitting: Family Medicine

## 2022-07-10 ENCOUNTER — Other Ambulatory Visit: Payer: Self-pay

## 2022-07-10 ENCOUNTER — Ambulatory Visit: Payer: Medicare Other | Admitting: Family Medicine

## 2022-07-10 DIAGNOSIS — M25562 Pain in left knee: Secondary | ICD-10-CM

## 2022-07-10 DIAGNOSIS — G8929 Other chronic pain: Secondary | ICD-10-CM

## 2022-07-10 DIAGNOSIS — M1712 Unilateral primary osteoarthritis, left knee: Secondary | ICD-10-CM | POA: Diagnosis not present

## 2022-07-10 DIAGNOSIS — M170 Bilateral primary osteoarthritis of knee: Secondary | ICD-10-CM

## 2022-07-10 DIAGNOSIS — M25561 Pain in right knee: Secondary | ICD-10-CM

## 2022-07-10 MED ORDER — TRIAMCINOLONE ACETONIDE 32 MG IX SRER
32.0000 mg | Freq: Once | INTRA_ARTICULAR | Status: AC
Start: 2022-07-10 — End: 2022-07-10
  Administered 2022-07-10: 32 mg via INTRA_ARTICULAR

## 2022-07-10 NOTE — Patient Instructions (Signed)
Thank you for coming in today.   You received an injection today. Seek immediate medical attention if the joint becomes red, extremely painful, or is oozing fluid.   I've referred you to Plastic Surgery.  Let us know if you don't hear from them in one week.

## 2022-07-10 NOTE — Progress Notes (Signed)
Renee Kerr presents to clinic today for Zilretta injection left knee  Procedure: Real-time Ultrasound Guided Injection of left knee joint superior lateral patellar space Device: Philips Affiniti 50G Images permanently stored and available for review in PACS Verbal informed consent obtained.  Discussed risks and benefits of procedure. Warned about infection, bleeding, hyperglycemia damage to structures among others. Patient expresses understanding and agreement Time-out conducted.   Noted no overlying erythema, induration, or other signs of local infection.   Skin prepped in a sterile fashion.   Local anesthesia: Topical Ethyl chloride.   With sterile technique and under real time ultrasound guidance: Zilretta 32 mg injected into knee joint. Fluid seen entering the joint capsule.   Completed without difficulty   Advised to call if fevers/chills, erythema, induration, drainage, or persistent bleeding.   Images permanently stored and available for review in the ultrasound unit.  Impression: Technically successful ultrasound guided injection.  Lot number: [id number]  Hyaluronic acid injections are also authorized.  We could proceed with those if Zilretta does not work.  We also talked about the increased fatty deposition in the medial aspect of her knees and thighs.  This is painful and interferes with her gait.  Referred to plastic surgery to consider liposuction.  In my opinion this is medically beneficial and necessary and worth a consultation with plastic surgery..  Total encounter time 15 minutes including face-to-face time with the patient and, reviewing past medical record, and charting on the date of service.   Time-based billing includes time to discuss her medial knee fatty deposition and liposuction and referral.  Did not include any time-based billing for Zilretta injection.

## 2022-07-15 NOTE — Telephone Encounter (Signed)
Pt received Zilretta inj for LEFT knee OA 07/10/22. Can consider repeat on or after 10/04/22

## 2022-07-15 NOTE — Telephone Encounter (Signed)
Pt received Zilretta inj 07/09/32.   Hold on Orthovisc

## 2022-07-17 DIAGNOSIS — N950 Postmenopausal bleeding: Secondary | ICD-10-CM | POA: Diagnosis not present

## 2022-07-18 ENCOUNTER — Encounter: Payer: Self-pay | Admitting: Family Medicine

## 2022-07-21 ENCOUNTER — Ambulatory Visit: Payer: Medicare Other | Admitting: Family Medicine

## 2022-07-24 ENCOUNTER — Ambulatory Visit: Payer: Medicare Other | Admitting: Obstetrics and Gynecology

## 2022-07-24 ENCOUNTER — Encounter: Payer: Self-pay | Admitting: Obstetrics and Gynecology

## 2022-07-28 ENCOUNTER — Encounter (HOSPITAL_BASED_OUTPATIENT_CLINIC_OR_DEPARTMENT_OTHER): Payer: Self-pay | Admitting: Obstetrics & Gynecology

## 2022-07-28 NOTE — Progress Notes (Signed)
Spoke w/ via phone for pre-op interview---Renee Kerr needs dos----  NONE             Kerr results------ COVID test -----patient states asymptomatic no test needed Arrive at -------0530 NPO after MN NO Solid Food.  Clear liquids from MN until---0430 Med rec completed Medications to take morning of surgery -----Bring Albuterol inhaler Diabetic medication ----- Patient instructed no nail polish to be worn day of surgery Patient instructed to bring photo id and insurance card day of surgery Patient aware to have Driver (ride ) / caregiver Husband Renee Kerr   for 24 hours after surgery  Patient Special Instructions ----- Pre-Op special Instructions ----- Patient verbalized understanding of instructions that were given at this phone interview. Patient denies shortness of breath, chest pain, fever, cough at this phone interview.

## 2022-07-29 ENCOUNTER — Ambulatory Visit: Payer: Medicare Other | Admitting: Family Medicine

## 2022-08-15 NOTE — H&P (Signed)
Renee Kerr is an 67 y.o. female [phone] with postmenopausal bleeding.  Patient is not taking HRT.  U/S on 5/23 showed 75 cc uterine volume with 6.7 mm EMS.  Patient has h/o endometrial polyps many years ago.  BMI 45.5.  Pertinent Gynecological History: Menses: post-menopausal Bleeding: post menopausal bleeding Contraception: post menopausal status DES exposure: unknown Blood transfusions: none Sexually transmitted diseases: no past history Previous GYN Procedures: DNC and C/S x 2   Last mammogram: normal Date: 07/25/22 Last pap: normal Date: "years ago" OB History: G3, P2012   Menstrual History: Menarche age: n/a No LMP recorded. Patient is postmenopausal.    Past Medical History:  Diagnosis Date   Asthma    Chest pain    GERD (gastroesophageal reflux disease)    Heart palpitations    Joint pain, knee    bilateral   Obesity    Pre-diabetes     Past Surgical History:  Procedure Laterality Date   CESAREAN SECTION  02/11/1984   x2 with both children   COLONOSCOPY WITH ESOPHAGOGASTRODUODENOSCOPY (EGD)  2013    Family History  Problem Relation Age of Onset   Heart attack Mother        mid 67s   Heart attack Father        80, died with first MI   Hypertension Father    Diabetes Brother    Hypertension Brother    Cancer Maternal Grandmother        cervical cancer    Social History:  reports that she quit smoking about 38 years ago. Her smoking use included cigarettes. She has a 4.00 pack-year smoking history. She has never used smokeless tobacco. She reports that she does not drink alcohol and does not use drugs.  Allergies:  Allergies  Allergen Reactions   Wound Dressing Adhesive Hives    Coban only, OK with band-aids and other tape   Tramadol Other (See Comments)    Not able to tolerate, GI Intolerance    No medications prior to admission.    Review of Systems  Height 4\' 11"  (1.499 m), weight 96.2 kg. Physical Exam Constitutional:      Appearance:  Normal appearance.  HENT:     Head: Normocephalic and atraumatic.  Pulmonary:     Effort: Pulmonary effort is normal.  Abdominal:     Palpations: Abdomen is soft.  Musculoskeletal:        General: Normal range of motion.     Cervical back: Normal range of motion.  Skin:    General: Skin is warm and dry.  Neurological:     Mental Status: She is alert and oriented to person, place, and time.  Psychiatric:        Mood and Affect: Mood normal.        Behavior: Behavior normal.     No results found for this or any previous visit (from the past 24 hour(s)).  No results found.  Assessment/Plan: 67yo with postmenopausal bleeding and thickened EMS -H/S, D&C, possible myosure Patient is counseled re: risk of bleeding, infection, scarring and damage to surrounding structures.  She is informed of steps of procedure as well as postop expectations and limitations.  All questions were answered and patient wishes to proceed.  Mitchel Honour 08/15/2022, 8:13 AM

## 2022-08-17 NOTE — Anesthesia Preprocedure Evaluation (Signed)
Anesthesia Evaluation  Patient identified by MRN, date of birth, ID band Patient awake    Reviewed: Allergy & Precautions, NPO status , Patient's Chart, lab work & pertinent test results  Airway Mallampati: II  TM Distance: >3 FB     Dental  (+) Dental Advisory Given, Caps, Partial Upper, Partial Lower   Pulmonary asthma , former smoker   Pulmonary exam normal breath sounds clear to auscultation       Cardiovascular negative cardio ROS Normal cardiovascular exam Rhythm:Regular Rate:Normal     Neuro/Psych   Anxiety     negative neurological ROS     GI/Hepatic Neg liver ROS,GERD  Medicated,,  Endo/Other  negative endocrine ROS  Pre diabetes Obesity  Renal/GU negative Renal ROS  negative genitourinary   Musculoskeletal  (+) Arthritis , Osteoarthritis,  Osteoporosis    Abdominal  (+) + obese  Peds  Hematology  (+) Blood dyscrasia, anemia   Anesthesia Other Findings   Reproductive/Obstetrics PMB                              Anesthesia Physical Anesthesia Plan  ASA: 2  Anesthesia Plan: General   Post-op Pain Management: Minimal or no pain anticipated   Induction: Intravenous  PONV Risk Score and Plan: 4 or greater and Treatment may vary due to age or medical condition  Airway Management Planned: LMA  Additional Equipment: None  Intra-op Plan:   Post-operative Plan: Extubation in OR  Informed Consent: I have reviewed the patients History and Physical, chart, labs and discussed the procedure including the risks, benefits and alternatives for the proposed anesthesia with the patient or authorized representative who has indicated his/her understanding and acceptance.     Dental advisory given  Plan Discussed with: Anesthesiologist and CRNA  Anesthesia Plan Comments:         Anesthesia Quick Evaluation

## 2022-08-18 ENCOUNTER — Encounter (HOSPITAL_BASED_OUTPATIENT_CLINIC_OR_DEPARTMENT_OTHER): Payer: Self-pay | Admitting: Obstetrics & Gynecology

## 2022-08-18 ENCOUNTER — Ambulatory Visit (HOSPITAL_BASED_OUTPATIENT_CLINIC_OR_DEPARTMENT_OTHER)
Admission: RE | Admit: 2022-08-18 | Discharge: 2022-08-18 | Disposition: A | Discharge status: Home / Self Care | Payer: Medicare Other | Attending: Obstetrics & Gynecology | Admitting: Obstetrics & Gynecology

## 2022-08-18 ENCOUNTER — Ambulatory Visit (HOSPITAL_BASED_OUTPATIENT_CLINIC_OR_DEPARTMENT_OTHER): Payer: Medicare Other | Admitting: Anesthesiology

## 2022-08-18 ENCOUNTER — Other Ambulatory Visit: Payer: Self-pay

## 2022-08-18 ENCOUNTER — Encounter (HOSPITAL_BASED_OUTPATIENT_CLINIC_OR_DEPARTMENT_OTHER)
Admission: RE | Disposition: A | Discharge status: Home / Self Care | Payer: Self-pay | Source: Home / Self Care | Attending: Obstetrics & Gynecology

## 2022-08-18 DIAGNOSIS — N840 Polyp of corpus uteri: Secondary | ICD-10-CM | POA: Diagnosis not present

## 2022-08-18 DIAGNOSIS — N950 Postmenopausal bleeding: Secondary | ICD-10-CM | POA: Diagnosis not present

## 2022-08-18 DIAGNOSIS — Z01818 Encounter for other preprocedural examination: Secondary | ICD-10-CM

## 2022-08-18 DIAGNOSIS — Z87891 Personal history of nicotine dependence: Secondary | ICD-10-CM | POA: Diagnosis not present

## 2022-08-18 DIAGNOSIS — R9389 Abnormal findings on diagnostic imaging of other specified body structures: Secondary | ICD-10-CM | POA: Diagnosis not present

## 2022-08-18 HISTORY — DX: Gastro-esophageal reflux disease without esophagitis: K21.9

## 2022-08-18 HISTORY — PX: DILATATION & CURETTAGE/HYSTEROSCOPY WITH MYOSURE: SHX6511

## 2022-08-18 HISTORY — DX: Prediabetes: R73.03

## 2022-08-18 LAB — CBC
HCT: 38.7 % (ref 36.0–46.0)
Hemoglobin: 11.9 g/dL — ABNORMAL LOW (ref 12.0–15.0)
MCH: 25.6 pg — ABNORMAL LOW (ref 26.0–34.0)
MCHC: 30.7 g/dL (ref 30.0–36.0)
MCV: 83.2 fL (ref 80.0–100.0)
Platelets: 246 10*3/uL (ref 150–400)
RBC: 4.65 MIL/uL (ref 3.87–5.11)
RDW: 14.6 % (ref 11.5–15.5)
WBC: 9.1 10*3/uL (ref 4.0–10.5)
nRBC: 0 % (ref 0.0–0.2)

## 2022-08-18 LAB — ABO/RH: ABO/RH(D): O POS

## 2022-08-18 LAB — TYPE AND SCREEN
ABO/RH(D): O POS
Antibody Screen: NEGATIVE

## 2022-08-18 SURGERY — DILATATION & CURETTAGE/HYSTEROSCOPY WITH MYOSURE
Anesthesia: General | Site: Uterus

## 2022-08-18 MED ORDER — KETOROLAC TROMETHAMINE 30 MG/ML IJ SOLN
INTRAMUSCULAR | Status: DC | PRN
  Administered 2022-08-18: 30 mg via INTRAVENOUS

## 2022-08-18 MED ORDER — PROPOFOL 10 MG/ML IV BOLUS
INTRAVENOUS | Status: AC
  Filled 2022-08-18: qty 20

## 2022-08-18 MED ORDER — KETOROLAC TROMETHAMINE 30 MG/ML IJ SOLN
INTRAMUSCULAR | Status: AC
  Filled 2022-08-18: qty 1

## 2022-08-18 MED ORDER — MIDAZOLAM HCL 2 MG/2ML IJ SOLN
INTRAMUSCULAR | Status: AC
  Filled 2022-08-18: qty 2

## 2022-08-18 MED ORDER — LACTATED RINGERS IV SOLN: INTRAVENOUS | Status: DC

## 2022-08-18 MED ORDER — OXYCODONE HCL 5 MG/5ML PO SOLN: 5.0000 mg | Freq: Once | ORAL | Status: DC | PRN

## 2022-08-18 MED ORDER — ONDANSETRON HCL 4 MG/2ML IJ SOLN
INTRAMUSCULAR | Status: DC | PRN
  Administered 2022-08-18: 4 mg via INTRAVENOUS

## 2022-08-18 MED ORDER — PHENYLEPHRINE HCL (PRESSORS) 10 MG/ML IV SOLN
INTRAVENOUS | Status: DC | PRN
  Administered 2022-08-18 (×3): 80 ug via INTRAVENOUS

## 2022-08-18 MED ORDER — IBUPROFEN 600 MG PO TABS: 600.0000 mg | ORAL_TABLET | Freq: Four times a day (QID) | ORAL | 0 refills | Status: AC | PRN

## 2022-08-18 MED ORDER — OXYCODONE-ACETAMINOPHEN 5-325 MG PO TABS: 1.0000 | ORAL_TABLET | ORAL | 0 refills | Status: DC | PRN

## 2022-08-18 MED ORDER — ONDANSETRON HCL 4 MG/2ML IJ SOLN: 4.0000 mg | Freq: Once | INTRAMUSCULAR | Status: DC | PRN

## 2022-08-18 MED ORDER — FENTANYL CITRATE (PF) 100 MCG/2ML IJ SOLN
INTRAMUSCULAR | Status: DC | PRN
  Administered 2022-08-18: 50 ug via INTRAVENOUS
  Administered 2022-08-18 (×2): 25 ug via INTRAVENOUS

## 2022-08-18 MED ORDER — POVIDONE-IODINE 10 % EX SWAB: 2.0000 | Freq: Once | CUTANEOUS | Status: DC

## 2022-08-18 MED ORDER — ONDANSETRON HCL 4 MG/2ML IJ SOLN
INTRAMUSCULAR | Status: AC
  Filled 2022-08-18: qty 2

## 2022-08-18 MED ORDER — PROPOFOL 10 MG/ML IV BOLUS
INTRAVENOUS | Status: DC | PRN
  Administered 2022-08-18: 70 mg via INTRAVENOUS
  Administered 2022-08-18: 130 mg via INTRAVENOUS

## 2022-08-18 MED ORDER — SODIUM CHLORIDE 0.9 % IR SOLN
Status: DC | PRN
  Administered 2022-08-18: 3000 mL

## 2022-08-18 MED ORDER — OXYCODONE HCL 5 MG PO TABS: 5.0000 mg | ORAL_TABLET | Freq: Once | ORAL | Status: DC | PRN

## 2022-08-18 MED ORDER — DEXAMETHASONE SODIUM PHOSPHATE 4 MG/ML IJ SOLN
INTRAMUSCULAR | Status: DC | PRN
  Administered 2022-08-18: 5 mg via INTRAVENOUS

## 2022-08-18 MED ORDER — LIDOCAINE 2% (20 MG/ML) 5 ML SYRINGE
INTRAMUSCULAR | Status: DC | PRN
  Administered 2022-08-18: 40 mg via INTRAVENOUS

## 2022-08-18 MED ORDER — LIDOCAINE HCL 1 % IJ SOLN
INTRAMUSCULAR | Status: DC | PRN
  Administered 2022-08-18: 10 mL

## 2022-08-18 MED ORDER — LIDOCAINE HCL (PF) 2 % IJ SOLN
INTRAMUSCULAR | Status: AC
  Filled 2022-08-18: qty 5

## 2022-08-18 MED ORDER — PHENYLEPHRINE 80 MCG/ML (10ML) SYRINGE FOR IV PUSH (FOR BLOOD PRESSURE SUPPORT)
PREFILLED_SYRINGE | INTRAVENOUS | Status: AC
  Filled 2022-08-18: qty 10

## 2022-08-18 MED ORDER — FENTANYL CITRATE (PF) 100 MCG/2ML IJ SOLN: 25.0000 ug | INTRAMUSCULAR | Status: DC | PRN

## 2022-08-18 MED ORDER — FENTANYL CITRATE (PF) 100 MCG/2ML IJ SOLN
INTRAMUSCULAR | Status: AC
  Filled 2022-08-18: qty 2

## 2022-08-18 MED ORDER — DEXAMETHASONE SODIUM PHOSPHATE 10 MG/ML IJ SOLN
INTRAMUSCULAR | Status: AC
  Filled 2022-08-18: qty 1

## 2022-08-18 SURGICAL SUPPLY — 23 items
CATH ROBINSON RED A/P 16FR (CATHETERS) ×1 IMPLANT
DEVICE MYOSURE LITE (MISCELLANEOUS) IMPLANT
DEVICE MYOSURE REACH (MISCELLANEOUS) IMPLANT
DILATOR CANAL MILEX (MISCELLANEOUS) IMPLANT
DRSG TELFA 3X8 NADH STRL (GAUZE/BANDAGES/DRESSINGS) ×1 IMPLANT
GAUZE 4X4 16PLY ~~LOC~~+RFID DBL (SPONGE) ×2 IMPLANT
GLOVE BIO SURGEON STRL SZ 6 (GLOVE) ×1 IMPLANT
GLOVE BIOGEL PI IND STRL 6 (GLOVE) ×1 IMPLANT
GLOVE ECLIPSE 6.0 STRL STRAW (GLOVE) ×1 IMPLANT
GLOVE SS PI 5.5 STRL (GLOVE) ×1 IMPLANT
GOWN STRL REUS W/TWL LRG LVL3 (GOWN DISPOSABLE) ×1 IMPLANT
IV NS IRRIG 3000ML ARTHROMATIC (IV SOLUTION) ×1 IMPLANT
KIT PROCEDURE FLUENT (KITS) ×1 IMPLANT
KIT TURNOVER CYSTO (KITS) ×1 IMPLANT
LOOP CUTTING BIPOLAR 21FR (ELECTRODE) IMPLANT
MYOSURE XL FIBROID (MISCELLANEOUS)
PACK VAGINAL MINOR WOMEN LF (CUSTOM PROCEDURE TRAY) ×1 IMPLANT
PAD OB MATERNITY 4.3X12.25 (PERSONAL CARE ITEMS) ×1 IMPLANT
SEAL CERVICAL OMNI LOK (ABLATOR) IMPLANT
SEAL ROD LENS SCOPE MYOSURE (ABLATOR) ×1 IMPLANT
SLEEVE SCD COMPRESS KNEE MED (STOCKING) ×1 IMPLANT
SYSTEM TISS REMOVAL MYOSURE XL (MISCELLANEOUS) IMPLANT
WATER STERILE IRR 500ML POUR (IV SOLUTION) ×1 IMPLANT

## 2022-08-18 NOTE — Anesthesia Procedure Notes (Signed)
Procedure Name: LMA Insertion Date/Time: 08/18/2022 7:30 AM  Performed by: Jessica Priest, CRNAPre-anesthesia Checklist: Patient identified, Emergency Drugs available, Suction available, Patient being monitored and Timeout performed Patient Re-evaluated:Patient Re-evaluated prior to induction Oxygen Delivery Method: Circle system utilized Preoxygenation: Pre-oxygenation with 100% oxygen Induction Type: IV induction Ventilation: Mask ventilation without difficulty LMA: LMA inserted LMA Size: 4.0 Number of attempts: 1 Airway Equipment and Method: Bite block Placement Confirmation: positive ETCO2, breath sounds checked- equal and bilateral and CO2 detector Tube secured with: Tape Dental Injury: Teeth and Oropharynx as per pre-operative assessment

## 2022-08-18 NOTE — Transfer of Care (Signed)
Immediate Anesthesia Transfer of Care Note  Patient: Renee Kerr  Procedure(s) Performed: Procedure(s) (LRB): DILATATION & CURETTAGE/HYSTEROSCOPY WITH POSSIBLE MYOSURE LITE (N/A)  Patient Location: PACU  Anesthesia Type: General  Level of Consciousness: awake, sedated, patient cooperative and responds to stimulation  Airway & Oxygen Therapy: Patient Spontanous Breathing and Patient connected to [place] oxygen  Post-op Assessment: Report given to PACU RN, Post -op Vital signs reviewed and stable and Patient moving all extremities  Post vital signs: Reviewed and stable  Complications: No apparent anesthesia complications

## 2022-08-18 NOTE — Discharge Instructions (Addendum)
Call MD for T>100.4, heavy vaginal bleeding, severe abdominal pain, intractable nausea and/or vomiting, or respiratory distress.  Call office to schedule postop appointment in 2 weeks.  Pelvic rest x 4 weeks.  No driving while taking narcotics.   D & C Home care Instructions:   Personal hygiene:  Used sanitary napkins for vaginal drainage not tampons. Shower or tub bathe the day after your procedure. No douching until bleeding stops. Always wipe from front to back after  Elimination.  Activity: Do not drive or operate any equipment today. The effects of the anesthesia are still present and drowsiness may result. Rest today, not necessarily flat bed rest, just take it easy. You may resume your normal activity in one to 2 days.  Sexual activity: No intercourse for one week or as indicated by your physician  Diet: Eat a light diet as desired this evening. You may resume a regular diet tomorrow.    General Expectations of your surgery: Vaginal bleeding should be no heavier than a normal period. Spotting may continue up to 10 days. Mild cramps may continue for a couple of days. You may have a regular period in 2-6 weeks.  Unexpected observations call your doctor if these occur: persistent or heavy bleeding. Severe abdominal cramping or pain. Elevation of temperature greater than 100.16F

## 2022-08-18 NOTE — Op Note (Signed)
PREOPERATIVE DIAGNOSIS:  67 y.o. with postmenopausal bleeding, thickened endometrial stripe  POSTOPERATIVE DIAGNOSIS: The same  PROCEDURE: Hysteroscopy, Dilation and Curettage, Myosure Lite  SURGEON:  Dr. Mitchel Honour  INDICATIONS: 67 y.o. female here for scheduled surgery for H/S, D&C, possible Myosure.   Risks of surgery were discussed with the patient including but not limited to: bleeding which may require transfusion; infection which may require antibiotics; injury to uterus or surrounding organs; intrauterine scarring which may impair future fertility; need for additional procedures including laparotomy or laparoscopy; and other postoperative/anesthesia complications. Written informed consent was obtained.    FINDINGS:  A 6 week size uterus.  3 endometrial polyps in uterine cavity.  Normal ostia bilaterally.  ANESTHESIA:   General, paracervical block  ESTIMATED BLOOD LOSS:  Less than 20 ml  SPECIMENS: Endometrial curettings sent to pathology  COMPLICATIONS:  None immediate.  PROCEDURE DETAILS:  The patient was taken to the operating room where general anesthesia was administered and was found to be adequate.  After an adequate timeout was performed, she was placed in the dorsal lithotomy position and examined; then prepped and draped in the sterile manner.   Her bladder was catheterized for 75cc of clear, yellow urine. A speculum was then placed in the patient's vagina and a single tooth tenaculum was applied to the anterior lip of the cervix.   A paracervical block using 10 ml of 1% lidocaine was administered.  The uteus was sounded to 7 cm and dilated manually with metal dilators to accommodate the operative hysteroscope.  Once the cervix was dilated, the hysteroscope was inserted under direct visualization using saline as a suspension medium.  The uterine cavity was carefully examined, both ostia were recognized as well as 3 endometrial polyps.   Myosure Lite was opened and inserted into  the uterine cavity.  Polyps were entirely removed using Myosure.  After further careful visualization of the uterine cavity, the hysteroscope was removed under direct visualization.  A sharp curettage was then performed to obtain a scant amount of endometrial curettings.  The tenaculum was removed from the anterior lip of the cervix and the vaginal speculum was removed after noting good hemostasis.  The patient tolerated the procedure well and was taken to the recovery area awake, extubated and in stable condition.

## 2022-08-18 NOTE — Anesthesia Postprocedure Evaluation (Signed)
Anesthesia Post Note  Patient: Idell Duquaine  Procedure(s) Performed: DILATATION & CURETTAGE/HYSTEROSCOPY WITH POSSIBLE MYOSURE LITE (Uterus)     Patient location during evaluation: PACU Anesthesia Type: General Level of consciousness: awake and alert and oriented Pain management: pain level controlled Vital Signs Assessment: post-procedure vital signs reviewed and stable Respiratory status: spontaneous breathing, nonlabored ventilation and respiratory function stable Cardiovascular status: blood pressure returned to baseline and stable Postop Assessment: no apparent nausea or vomiting Anesthetic complications: no   No notable events documented.  Last Vitals:  Vitals:   08/18/22 0830 08/18/22 0905  BP: 136/65 127/61  Pulse:  (!) 58  Resp: 12 18  Temp: 36.7 C   SpO2:  96%    Last Pain:  Vitals:   08/18/22 0830  TempSrc:   PainSc: 0-No pain                 Minna Dumire A.

## 2022-08-18 NOTE — Progress Notes (Signed)
No change to H&P.  Jawon Dipiero, DO 

## 2022-08-19 ENCOUNTER — Encounter (HOSPITAL_BASED_OUTPATIENT_CLINIC_OR_DEPARTMENT_OTHER): Payer: Self-pay | Admitting: Obstetrics & Gynecology

## 2022-08-19 LAB — SURGICAL PATHOLOGY

## 2022-08-21 ENCOUNTER — Encounter: Payer: Self-pay | Admitting: Plastic Surgery

## 2022-08-21 ENCOUNTER — Ambulatory Visit: Payer: Medicare Other | Admitting: Plastic Surgery

## 2022-08-21 VITALS — BP 135/71 | HR 66 | Ht 59.0 in | Wt 215.0 lb

## 2022-08-21 DIAGNOSIS — E65 Localized adiposity: Secondary | ICD-10-CM

## 2022-08-21 NOTE — Progress Notes (Signed)
Referring Provider Mliss Sax, MD [address] [place],  [place] [address]   CC:  Chief Complaint  Patient presents with   Consult      Renee Kerr is an 67 y.o. female.  HPI: Renee Kerr is a 67 year old female who is referred today for consideration of medial thigh and knee liposuction as treatment for knee pain.  Patient states that she has pain in her knees and specifically in the medial aspect where the fat accumulation on each knee rubs against each other. 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  She states that she has had lower extremity fat accumulation since that time.  Allergies  Allergen Reactions   Wound Dressing Adhesive Hives    Coban only, OK with band-aids and other tape   Tramadol Other (See Comments)    Not able to tolerate, GI Intolerance    Outpatient Encounter Medications as of 08/21/2022  Medication Sig   acetaminophen (TYLENOL) 500 MG tablet Take 1 tablet (500 mg total) by mouth every 6 (six) hours as needed.   albuterol (VENTOLIN HFA) 108 (90 Base) MCG/ACT inhaler INHALE 2 PUFFS INTO THE LUNGS EVERY 4 HOURS AS NEEDED FOR WHEEZING OR SHORTNESS OF BREATH.   Ascorbic Acid (VITAMIN C PO) Take by mouth.   azelastine (ASTELIN) 0.1 % nasal spray Place 1 spray into both nostrils 2 (two) times daily. Use in each nostril as directed   Cyanocobalamin (VITAMIN B-12 PO) Take by mouth.   famotidine (PEPCID) 20 MG tablet Take 20 mg by mouth 2 (two) times daily.   fluticasone (FLONASE) 50 MCG/ACT nasal spray    fluticasone-salmeterol (ADVAIR DISKUS) 250-50 MCG/ACT AEPB Inhale 1 puff into the lungs in the morning and at bedtime.   ibuprofen (ADVIL) 600 MG tablet Take 1 tablet (600 mg total) by mouth every 6 (six) hours as needed.   ketoconazole (NIZORAL) 2 % cream Apply 1 Application topically daily. For 14 days.   Multiple Vitamins-Minerals (ZINC PO) Take by mouth.   omeprazole (PRILOSEC)  40 MG capsule TAKE 1 CAPSULE (40 MG TOTAL) BY MOUTH DAILY.   oxyCODONE-acetaminophen (PERCOCET) 5-325 MG tablet Take 1 tablet by mouth every 4 (four) hours as needed for severe pain.   Vitamin D, Ergocalciferol, (DRISDOL) 1.25 MG (50000 UNIT) CAPS capsule Take 1 capsule (50,000 Units total) by mouth every 7 (seven) days. Take on saturdays   No facility-administered encounter medications on file as of 08/21/2022.     Past Medical History:  Diagnosis Date   Asthma    Chest pain    GERD (gastroesophageal reflux disease)    Heart palpitations    Joint pain, knee    bilateral   Obesity    Pre-diabetes     Past Surgical History:  Procedure Laterality Date   CESAREAN SECTION  02/11/1984   x2 with both children   COLONOSCOPY WITH ESOPHAGOGASTRODUODENOSCOPY (EGD)  2013   DILATATION & CURETTAGE/HYSTEROSCOPY WITH MYOSURE N/A 08/18/2022   Procedure: DILATATION & CURETTAGE/HYSTEROSCOPY WITH POSSIBLE MYOSURE LITE;  Surgeon: Mitchel Honour, DO;  Location: [facility];  Service: Gynecology;  Laterality: N/A;    Family History  Problem Relation Age of Onset   Heart attack Mother        mid 80s   Heart attack Father        13, died with first MI   Hypertension Father    Diabetes Brother    Hypertension Brother  Cancer Maternal Grandmother        cervical cancer    Social History   Social History Narrative   Lives in [place] with husband. Has two sons one lives in [place] and one lives in [place]. 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     Review of Systems General: Denies fevers, chills, weight loss CV: Denies chest pain, shortness of breath, palpitations Lower extremities: Patient has fat from the waist down to the knees planes about discomfort in the knees where the accumulated fat rubs together.  Physical Exam    08/21/2022   10:34 AM 08/18/2022    9:05 AM 08/18/2022    8:30 AM  Vitals with BMI  Height 4\' 11"     Weight 215 lbs    BMI 43.4     Systolic 135 127 782  Diastolic 71 61 65  Pulse 66 58     General:  No acute distress,  Alert and oriented, Non-Toxic, Normal speech and affect Lower extremities: As noted above the patient has a significant amount of subcutaneous fat on both the medial and lateral thighs knees hips and buttocks. Mammogram: July 2023 BI-RADS 2 Assessment/Plan Lower extremity subcutaneous fat: Patient has extensive subcutaneous fat on both lower extremities.  She is referred for consideration of liposuction on the medial aspect of her knees.  I explained to the patient that this would not be in her best interest.  Liposuction often disrupts the lymphatics and local liposuction is unlikely to provide her any long-term relief and will potentially increase her risk of complications from lymphatic disruption.  Additionally if she requires any type of orthopedic procedure in the future the scarring from the liposuction is likely to make this more difficult.  Not offered her any surgical procedure. I have offered to refer her to healthy weight and wellness.  Patient's BMI is currently at 43.  She would benefit significantly from weight loss.  This would likely improve the discomfort that she is having from the subcutaneous fat accumulation in her lower extremities.  Additionally have I have offered to place a referral to physical therapy to see if lymphatic massage would be of any benefit to her. Lastly I have encouraged her to speak with her primary care physician regarding current weight loss medications.  Many of these require comorbidities and I believe that the fat accumulation that is causing her discomfort on her lower extremities would be 1 of these indications.  Renee Kerr 08/21/2022, 11:13 AM

## 2022-09-11 ENCOUNTER — Encounter: Payer: Self-pay | Admitting: Family Medicine

## 2022-09-11 ENCOUNTER — Ambulatory Visit (INDEPENDENT_AMBULATORY_CARE_PROVIDER_SITE_OTHER): Payer: Medicare Other

## 2022-09-11 VITALS — BP 134/68 | HR 65 | Temp 97.8°F | Ht 59.0 in | Wt 214.4 lb

## 2022-09-11 DIAGNOSIS — J301 Allergic rhinitis due to pollen: Secondary | ICD-10-CM | POA: Diagnosis not present

## 2022-09-11 DIAGNOSIS — J4520 Mild intermittent asthma, uncomplicated: Secondary | ICD-10-CM

## 2022-09-11 LAB — POC COVID19 BINAXNOW: SARS Coronavirus 2 Ag: NEGATIVE

## 2022-09-11 MED ORDER — BENZONATATE 100 MG PO CAPS
100.0000 mg | ORAL_CAPSULE | Freq: Two times a day (BID) | ORAL | 0 refills | Status: AC | PRN
Start: 2022-09-11 — End: ?

## 2022-09-11 MED ORDER — HYDROCODONE BIT-HOMATROP MBR 5-1.5 MG/5ML PO SOLN
5.0000 mL | Freq: Three times a day (TID) | ORAL | 0 refills | Status: AC | PRN
Start: 2022-09-11 — End: ?

## 2022-09-11 NOTE — Assessment & Plan Note (Signed)
No wheezing at present. Continue albuterol as needed.

## 2022-09-11 NOTE — Progress Notes (Signed)
[facility] [address] [place] [address] Dept: [phone] Dept Fax: [phone]  Office Visit  Subjective:    Patient ID: Renee Kerr, female    DOB: 1955/06/24, 67 y.o..   MRN: [id number]  Chief Complaint  Patient presents with   Cough    C/o having cough, nasal drainage x 5 days.  Has taken Desylm and mucinex.    History of Present Illness:  Patient is in today for with a 5-day history of cough and post-nasal drip. She admits to some itchy, irritated eyes, though she denies sneezing. She has had some mild increase in wheezing which is responding to her albuterol. She is on a daily nasal steroid spray. These symptoms started when she was in [place] with her family visiting [place]. Prior to that, she had been on a 7-day cruise of [place] and [place]. None of the family she was with in [place] have been sick. She does have a history of allergic rhinitis and she feels that is what is flared. She is not on an antihistamine. She notes she had taken Claritin-D int he past, but didn't like the way it made her feel.  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   COVID-19 04/10/2022   Seasonal allergic rhinitis due to pollen 08/29/2021   Dysfunction of both eustachian tubes 08/29/2021   Chronic pain of both knees 07/04/2021   Viral syndrome 07/04/2021   Bloating 05/30/2021   Epigastric heaviness 05/30/2021   Slow transit constipation 05/30/2021   Encounter for hepatitis C screening test for low risk patient 05/30/2021   Pain of left lower leg 04/26/2021   Eczema 04/10/2021   Right foot pain 12/20/2018   Perennial allergic rhinitis with seasonal variation 06/25/2016   Vaginal dryness, menopausal 11/12/2015    Prediabetes 10/26/2015   Chronic pain syndrome 08/21/2015   Osteoporosis 08/01/2015   Arthritis 05/16/2015   Morbid obesity with BMI of 40.0-44.9, adult (HCC) 05/16/2015   Chest pain, atypical 12/23/2012   Anxiety 12/23/2012   Persistent asthma 04/28/2011   Vitamin D deficiency 09/09/2010   Allergic rhinitis 06/02/2008   ASTHMA 06/02/2008   LOW BACK PAIN 06/02/2008   COLONIC POLYPS, HX OF 06/02/2008   Mild intermittent asthma without complication 06/02/2008   Past Surgical History:  Procedure Laterality Date   CESAREAN SECTION  02/11/1984   x2 with both children   COLONOSCOPY WITH ESOPHAGOGASTRODUODENOSCOPY (EGD)  2013   DILATATION & CURETTAGE/HYSTEROSCOPY WITH MYOSURE N/A 08/18/2022   Procedure: DILATATION & CURETTAGE/HYSTEROSCOPY WITH POSSIBLE MYOSURE LITE;  Surgeon: Mitchel Honour, DO;  Location: [facility];  Service: Gynecology;  Laterality: N/A;   Family History  Problem Relation Age of Onset   Heart attack Mother        mid 61s   Heart attack Father        34, died with first MI   Hypertension Father    Diabetes Brother    Hypertension Brother    Cancer Maternal Grandmother        cervical cancer   Outpatient Medications Prior to Visit  Medication Sig Dispense Refill   acetaminophen (TYLENOL) 500 MG tablet Take 1 tablet (500 mg total) by mouth every 6 (six) hours as needed. 30 tablet 0   albuterol (  VENTOLIN HFA) 108 (90 Base) MCG/ACT inhaler INHALE 2 PUFFS INTO THE LUNGS EVERY 4 HOURS AS NEEDED FOR WHEEZING OR SHORTNESS OF BREATH. 18 each 2   Ascorbic Acid (VITAMIN C PO) Take by mouth.     azelastine (ASTELIN) 0.1 % nasal spray Place 1 spray into both nostrils 2 (two) times daily. Use in each nostril as directed 30 mL 12   Cyanocobalamin (VITAMIN B-12 PO) Take by mouth.     estradiol (ESTRACE) 0.1 MG/GM vaginal cream Place 1 Applicatorful vaginally 3 (three) times a week.     fluticasone (FLONASE) 50 MCG/ACT nasal spray      fluticasone-salmeterol  (ADVAIR DISKUS) 250-50 MCG/ACT AEPB Inhale 1 puff into the lungs in the morning and at bedtime. 1 each 5   ibuprofen (ADVIL) 600 MG tablet Take 1 tablet (600 mg total) by mouth every 6 (six) hours as needed. 30 tablet 0   ketoconazole (NIZORAL) 2 % cream Apply 1 Application topically daily. For 14 days. 60 g 0   Multiple Vitamins-Minerals (ZINC PO) Take by mouth.     omeprazole (PRILOSEC) 40 MG capsule Take 40 mg by mouth daily.     Vitamin D, Ergocalciferol, (DRISDOL) 1.25 MG (50000 UNIT) CAPS capsule Take 1 capsule (50,000 Units total) by mouth every 7 (seven) days. Take on saturdays 12 capsule 1   omeprazole (PRILOSEC) 40 MG capsule TAKE 1 CAPSULE (40 MG TOTAL) BY MOUTH DAILY. 90 capsule 1   famotidine (PEPCID) 20 MG tablet Take 20 mg by mouth 2 (two) times daily. (Patient not taking: Reported on 09/11/2022)     oxyCODONE-acetaminophen (PERCOCET) 5-325 MG tablet Take 1 tablet by mouth every 4 (four) hours as needed for severe pain. (Patient not taking: Reported on 09/11/2022) 10 tablet 0   No facility-administered medications prior to visit.   Allergies  Allergen Reactions   Wound Dressing Adhesive Hives    Coban only, OK with band-aids and other tape   Tramadol Other (See Comments)    Not able to tolerate, GI Intolerance     Objective:   Today's Vitals   09/11/22 0822  BP: 134/68  Pulse: 65  Temp: 97.8 F (36.6 C)  TempSrc: Temporal  SpO2: 97%  Weight: 214 lb 6.4 oz (97.3 kg)  Height: 4\' 11"  (1.499 m)   Body mass index is 43.3 kg/m.   General: Well developed, well nourished. No acute distress. HEENT: Normocephalic, non-traumatic. PERRL, EOMI. Conjunctiva clear. External ears normal. EAC and TMs   normal bilaterally. Nose clear with mild congestion and rhinorrhea. Mucous membranes moist. Mild to moderate   mucous streaking of the posterior oropharynx. Good dentition. Neck: Supple. No lymphadenopathy. No thyromegaly. Lungs: Clear to auscultation bilaterally. No wheezing, rales  or rhonchi. Psych: Alert and oriented. Normal mood and affect.  Health Maintenance Due  Topic Date Due   MAMMOGRAM  07/25/2022   Medicare Annual Wellness (AWV)  08/07/2022   Colonoscopy  08/25/2022   INFLUENZA VACCINE  09/11/2022   Lab Results POCT Covid: Neg.  Assessment & Plan:   Problem List Items Addressed This Visit       Respiratory   Mild intermittent asthma without complication    No wheezing at present. Continue albuterol as needed.      Seasonal allergic rhinitis due to pollen - Primary    This appears to be an allergy flare. I recommend she continue her fluticasone spray. She should add an oral antihistamine, but I recommend she use one without a decongestant.I will provide some  cough medicine. If her symptoms are not improving through the weekend, she shodul contact me regarding a steroid course.      Relevant Medications   benzonatate (TESSALON) 100 MG capsule   HYDROcodone bit-homatropine (HYCODAN) 5-1.5 MG/5ML syrup   Other Relevant Orders   POC COVID-19    Return if symptoms worsen or fail to improve.   Loyola Mast, MD

## 2022-09-11 NOTE — Assessment & Plan Note (Signed)
This appears to be an allergy flare. I recommend she continue her fluticasone spray. She should add an oral antihistamine, but I recommend she use one without a decongestant.I will provide some cough medicine. If her symptoms are not improving through the weekend, she shodul contact me regarding a steroid course.

## 2022-09-12 ENCOUNTER — Ambulatory Visit: Payer: Medicare Other | Admitting: Family Medicine

## 2022-09-24 ENCOUNTER — Telehealth: Payer: Self-pay | Admitting: Plastic Surgery

## 2022-09-24 NOTE — Telephone Encounter (Signed)
Patient called and said that [facility] does not treat Lymphedema. And they recommended [facility] or [facility]. Patient called [facility] at [phone] and they said they can see her but they need a referral. I faxed referral over

## 2022-09-25 ENCOUNTER — Ambulatory Visit: Payer: Medicare Other | Admitting: Family Medicine

## 2022-09-30 DIAGNOSIS — K080 Exfoliation of teeth due to systemic causes: Secondary | ICD-10-CM | POA: Diagnosis not present

## 2022-10-08 DIAGNOSIS — Z0289 Encounter for other administrative examinations: Secondary | ICD-10-CM

## 2022-10-10 ENCOUNTER — Ambulatory Visit (INDEPENDENT_AMBULATORY_CARE_PROVIDER_SITE_OTHER): Payer: Medicare Other | Admitting: Family Medicine

## 2022-10-10 ENCOUNTER — Encounter: Payer: Self-pay | Admitting: Family Medicine

## 2022-10-10 VITALS — BP 128/68 | HR 80 | Temp 97.2°F | Ht 59.0 in | Wt 218.6 lb

## 2022-10-10 DIAGNOSIS — I872 Venous insufficiency (chronic) (peripheral): Secondary | ICD-10-CM

## 2022-10-10 DIAGNOSIS — R0982 Postnasal drip: Secondary | ICD-10-CM | POA: Diagnosis not present

## 2022-10-10 DIAGNOSIS — I890 Lymphedema, not elsewhere classified: Secondary | ICD-10-CM | POA: Insufficient documentation

## 2022-10-10 DIAGNOSIS — R21 Rash and other nonspecific skin eruption: Secondary | ICD-10-CM | POA: Diagnosis not present

## 2022-10-10 DIAGNOSIS — R7303 Prediabetes: Secondary | ICD-10-CM

## 2022-10-10 NOTE — Progress Notes (Signed)
Established Patient Office Visit   Subjective:  Patient ID: Renee Kerr, female    DOB: 05/25/1955  Age: 67 y.o. MRN: [id number]  Chief Complaint  Patient presents with   Cough    Cough, post nasal drainage, x 3 weeks.    Rash    Rash on both feet x 2 months. Ointment not working.    Leg Swelling    Bilateral leg edema. Pt states she will be going to PT as soon as she can get an appointment.     Cough Associated symptoms include a rash. Pertinent negatives include no eye redness, myalgias, shortness of breath or wheezing.  Rash Associated symptoms include coughing and joint pain. Pertinent negatives include no congestion or shortness of breath.   Encounter Diagnoses  Name Primary?   Prediabetes Yes   Rash    Post-nasal drip    Chronic venous insufficiency    For follow-up of above.  Patient has used the ketoconazole cream for the rash on her feet and feels as though it is still there.  Her feet remain a little pruritic.  Ongoing postnasal drip with a dry cough.  She is using the Flonase intermittently.  There is been no fever or chills or difficulty breathing.  Occasional wheeze treated with as needed albuterol.  No facial pressure or rhinorrhea.  She has since been diagnosed with venous insufficiency in her lower extremities..  She has seen a vascular and vein specialist.  She has been referred to physical therapy for lymphedema.  Her appointment is October 1.  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  She has no history of diabetes and has never been treated for it.   Review of Systems  Constitutional: Negative.   HENT: Negative.  Negative for congestion and sinus pain.   Eyes:  Negative for blurred vision, discharge and redness.  Respiratory:  Positive for cough. Negative for sputum production, shortness of breath and wheezing.   Cardiovascular: Negative.   Gastrointestinal:   Negative for abdominal pain.  Genitourinary: Negative.   Musculoskeletal:  Positive for joint pain. Negative for myalgias.  Skin:  Positive for rash.  Neurological:  Negative for tingling, loss of consciousness and weakness.  Endo/Heme/Allergies:  Negative for polydipsia.     Current Outpatient Medications:    acetaminophen (TYLENOL) 500 MG tablet, Take 1 tablet (500 mg total) by mouth every 6 (six) hours as needed., Disp: 30 tablet, Rfl: 0   albuterol (VENTOLIN HFA) 108 (90 Base) MCG/ACT inhaler, INHALE 2 PUFFS INTO THE LUNGS EVERY 4 HOURS AS NEEDED FOR WHEEZING OR SHORTNESS OF BREATH., Disp: 18 each, Rfl: 2   Ascorbic Acid (VITAMIN C PO), Take by mouth., Disp: , Rfl:    azelastine (ASTELIN) 0.1 % nasal spray, Place 1 spray into both nostrils 2 (two) times daily. Use in each nostril as directed, Disp: 30 mL, Rfl: 12   Cyanocobalamin (VITAMIN B-12 PO), Take by mouth., Disp: , Rfl:    estradiol (ESTRACE) 0.1 MG/GM vaginal cream, Place 1 Applicatorful vaginally 3 (three) times a week., Disp: , Rfl:    famotidine (PEPCID) 20 MG tablet, Take 20 mg by mouth 2 (two) times daily., Disp: , Rfl:    fluticasone (FLONASE) 50 MCG/ACT nasal spray, , Disp: , Rfl:    fluticasone-salmeterol (ADVAIR DISKUS) 250-50 MCG/ACT AEPB, Inhale 1 puff into the lungs in the morning and at bedtime., Disp: 1  each, Rfl: 5   HYDROcodone bit-homatropine (HYCODAN) 5-1.5 MG/5ML syrup, Take 5 mLs by mouth every 8 (eight) hours as needed for cough., Disp: 120 mL, Rfl: 0   ibuprofen (ADVIL) 600 MG tablet, Take 1 tablet (600 mg total) by mouth every 6 (six) hours as needed., Disp: 30 tablet, Rfl: 0   ketoconazole (NIZORAL) 2 % cream, Apply 1 Application topically daily. For 14 days., Disp: 60 g, Rfl: 0   Multiple Vitamins-Minerals (ZINC PO), Take by mouth., Disp: , Rfl:    omeprazole (PRILOSEC) 40 MG capsule, Take 40 mg by mouth daily., Disp: , Rfl:    Vitamin D, Ergocalciferol, (DRISDOL) 1.25 MG (50000 UNIT) CAPS capsule, Take 1  capsule (50,000 Units total) by mouth every 7 (seven) days. Take on saturdays, Disp: 12 capsule, Rfl: 1   benzonatate (TESSALON) 100 MG capsule, Take 1 capsule (100 mg total) by mouth 2 (two) times daily as needed for cough. (Patient not taking: Reported on 10/10/2022), Disp: 20 capsule, Rfl: 0   oxyCODONE-acetaminophen (PERCOCET) 5-325 MG tablet, Take 1 tablet by mouth every 4 (four) hours as needed for severe pain. (Patient not taking: Reported on 09/11/2022), Disp: 10 tablet, Rfl: 0   Objective:     BP 128/68   Pulse 80   Temp (!) 97.2 F (36.2 C)   Ht 4\' 11"  (1.499 m)   Wt 218 lb 9.6 oz (99.2 kg)   SpO2 98%   BMI 44.15 kg/m  Wt Readings from Last 3 Encounters:  10/10/22 218 lb 9.6 oz (99.2 kg)  09/11/22 214 lb 6.4 oz (97.3 kg)  08/21/22 215 lb (97.5 kg)      Physical Exam Constitutional:      General: She is not in acute distress.    Appearance: Normal appearance. She is not ill-appearing, toxic-appearing or diaphoretic.  HENT:     Head: Normocephalic and atraumatic.     Right Ear: External ear normal.     Left Ear: External ear normal.     Mouth/Throat:     Mouth: Mucous membranes are moist.     Pharynx: Oropharynx is clear. No oropharyngeal exudate or posterior oropharyngeal erythema.  Eyes:     General: No scleral icterus.       Right eye: No discharge.        Left eye: No discharge.     Extraocular Movements: Extraocular movements intact.     Conjunctiva/sclera: Conjunctivae normal.     Pupils: Pupils are equal, round, and reactive to light.  Cardiovascular:     Rate and Rhythm: Normal rate and regular rhythm.  Pulmonary:     Effort: Pulmonary effort is normal. No respiratory distress.     Breath sounds: Normal breath sounds. No wheezing, rhonchi or rales.  Musculoskeletal:     Cervical back: No rigidity or tenderness.     Right lower leg: No edema.     Left lower leg: No edema.       Legs:       Feet:  Skin:    General: Skin is warm and dry.   Neurological:     Mental Status: She is alert and oriented to person, place, and time.  Psychiatric:        Mood and Affect: Mood normal.        Behavior: Behavior normal.      No results found for any visits on 10/10/22.    The ASCVD Risk score (Arnett DK, et al., 2019) failed to calculate for the following  reasons:   The valid total cholesterol range is 130 to 320 mg/dL    Assessment & Plan:   Prediabetes -     Basic metabolic panel -     Hemoglobin A1c  Rash -     Ambulatory referral to Dermatology  Post-nasal drip  Chronic venous insufficiency    Return in about 6 months (around 04/10/2023), or Please use flonase daily.  Offered to fill out FMLA forms through her physical therapy appointment planned for late September or early October.  It may help for her to stay out of work to keep her legs from a dependent position that driving the schoolbus would require.  Patient has seen a vascular and vein specialist.  She is seeing sports medicine for chronic knee pain.  Patient was disappointed that I could not do more or offer a cure.  I see no definite rash on her feet at this time.  Dermatology referral for second opinion.  Discussed using metformin to treat her prediabetes.  Patient prefers to go to medical wellness clinic first and lose weight before taking a pill.  Advised that it would take a long time for her to lose the weight and that we should start treatment in the meantime.  She declines treatment at this time.  Information was given on prediabetes.  Yes advised that it was important for her to use the Flonase daily for her drainage which would also help her cough.  Mliss Sax, MD  8/30 addendum: Left the clinic without having her labs drawn.

## 2022-10-15 ENCOUNTER — Encounter (INDEPENDENT_AMBULATORY_CARE_PROVIDER_SITE_OTHER): Payer: Self-pay | Admitting: Physician Assistant

## 2022-10-15 ENCOUNTER — Ambulatory Visit (INDEPENDENT_AMBULATORY_CARE_PROVIDER_SITE_OTHER): Payer: Medicare Other | Admitting: Physician Assistant

## 2022-10-15 VITALS — BP 145/81 | HR 74 | Temp 98.6°F | Ht <= 58 in | Wt 215.0 lb

## 2022-10-15 DIAGNOSIS — I872 Venous insufficiency (chronic) (peripheral): Secondary | ICD-10-CM

## 2022-10-15 DIAGNOSIS — E559 Vitamin D deficiency, unspecified: Secondary | ICD-10-CM

## 2022-10-15 DIAGNOSIS — R7303 Prediabetes: Secondary | ICD-10-CM | POA: Diagnosis not present

## 2022-10-15 DIAGNOSIS — Z6841 Body Mass Index (BMI) 40.0 and over, adult: Secondary | ICD-10-CM

## 2022-10-15 NOTE — Progress Notes (Signed)
Office: [phone]  /  Fax: [phone]   Initial Visit  Renee Kerr was seen in clinic today to evaluate for obesity. She is interested in losing weight to improve overall health and reduce the risk of weight related complications. She presents today to review program treatment options, initial physical assessment, and evaluation.     She was referred by: PCP  When asked what else they would like to accomplish? She states: Adopt healthier eating patterns, Improve energy levels and physical activity, Improve existing medical conditions, Reduce number of medications, Reduce risk for a surgery, Improve quality of life, Improve appearance, and Improve self-confidence  Weight history: Gained weight with pregnancies and never got back to pre-pregnancy weight. Menopause made even more difficult to loose weight.   When asked how has your weight affected you? She states: Contributed to medical problems, Contributed to orthopedic problems or mobility issues, and Problems with eating patterns  Some associated conditions: Arthritis:all over, Prediabetes, GERD, and Vitamin D Deficiency History of ricketts with growth decrease . Lympedema of lower extremities   Contributing factors: Eating patterns, Pregnancy, and Menopause  Weight promoting medications identified: None  Current nutrition plan: Other: Weight Watchers  Current level of physical activity: Limited due to chronic pain or orthopedic problems  Current or previous pharmacotherapy: None  Response to medication: Never tried medications   Past medical history includes:   Past Medical History:  Diagnosis Date   Asthma    Chest pain    GERD (gastroesophageal reflux disease)    Heart palpitations    Joint pain, knee    bilateral   Obesity    Pre-diabetes      Objective:   BP (!) 145/81   Pulse 74   Temp 98.6 F (37 C)   Ht 4\' 9"  (1.448 m)   Wt 215 lb (97.5 kg)   SpO2 99%   BMI 46.53 kg/m  She was weighed on the  bioimpedance scale: Body mass index is 46.53 kg/m.  Peak Weight:220 lb , Body Fat%:48 %, Visceral Fat Rating:19, Weight trend over the last 12 months: Unchanged  General:  Alert, oriented and cooperative. Patient is in no acute distress.  Respiratory: Normal respiratory effort, no problems with respiration noted   Gait: able to ambulate independently  Mental Status: Normal mood and affect. Normal behavior. Normal judgment and thought content.   DIAGNOSTIC DATA REVIEWED:  BMET    Component Value Date/Time   NA 140 06/18/2022 1640   K 3.6 06/18/2022 1640   CL 108 06/18/2022 1640   CO2 22 06/18/2022 1640   GLUCOSE 96 06/18/2022 1640   BUN 13 06/18/2022 1640   CREATININE 0.86 06/18/2022 1640   CALCIUM 8.5 (L) 06/18/2022 1640   GFRNONAA >60 06/18/2022 1640   GFRAA 86 (L) 06/13/2013 1735   Lab Results  Component Value Date   HGBA1C 6.2 06/23/2022   HGBA1C 6.1 (H) 12/24/2012   No results found for: "INSULIN" CBC    Component Value Date/Time   WBC 9.1 08/18/2022 0712   RBC 4.65 08/18/2022 0712   HGB 11.9 (L) 08/18/2022 0712   HCT 38.7 08/18/2022 0712   PLT 246 08/18/2022 0712   MCV 83.2 08/18/2022 0712   MCH 25.6 (L) 08/18/2022 0712   MCHC 30.7 08/18/2022 0712   RDW 14.6 08/18/2022 0712   Iron/TIBC/Ferritin/ %Sat    Component Value Date/Time   IRON 72 06/23/2022 1108   TIBC 290 06/23/2022 1108   FERRITIN 144 06/23/2022 1108   IRONPCTSAT 25 06/23/2022  1108   Lipid Panel     Component Value Date/Time   CHOL 123 06/23/2022 1108   TRIG 64.0 06/23/2022 1108   HDL 57.60 06/23/2022 1108   CHOLHDL 2 06/23/2022 1108   VLDL 12.8 06/23/2022 1108   LDLCALC 53 06/23/2022 1108   Hepatic Function Panel     Component Value Date/Time   PROT 7.4 06/18/2022 1640   ALBUMIN 3.5 06/18/2022 1640   AST 19 06/18/2022 1640   ALT 14 06/18/2022 1640   ALKPHOS 58 06/18/2022 1640   BILITOT 0.3 06/18/2022 1640   BILIDIR 0.1 06/02/2008 2333   IBILI 0.1 06/02/2008 2333      Component  Value Date/Time   TSH 1.37 06/23/2022 1108     Assessment and Plan:   Class 3 severe obesity due to excess calories with serious comorbidity and body mass index (BMI) of 45.0 to 49.9 in adult [facility])  Prediabetes  Chronic venous insufficiency  Vitamin D deficiency        Obesity Treatment / Action Plan:  Patient will work on garnering support from family and friends to begin weight loss journey. Will work on eliminating or reducing the presence of highly palatable, calorie dense foods in the home. Will complete provided nutritional and psychosocial assessment questionnaire before the next appointment. Will be scheduled for indirect calorimetry to determine resting energy expenditure in a fasting state.  This will allow [place] to create a reduced calorie, high-protein meal plan to promote loss of fat mass while preserving muscle mass. Will avoid skipping meals which may result in increased hunger signals and overeating at certain times. Will reduce the frequency of eating out and making healthier choices by advanced menu planning. Will work on reading labels, making healthier choices and watching portion sizes. Counseled on the health benefits of losing 5%-15% of total body weight. Was counseled on nutritional approaches to weight loss and benefits of reducing processed foods and consuming plant-based foods and high quality protein as part of nutritional weight management. Was counseled on pharmacotherapy and role as an adjunct in weight management.   Obesity Education Performed Today:  She was weighed on the bioimpedance scale and results were discussed and documented in the synopsis.  We discussed obesity as a disease and the importance of a more detailed evaluation of all the factors contributing to the disease.  We discussed the importance of long term lifestyle changes which include nutrition, exercise and behavioral modifications as well as the importance of customizing this to  her specific health and social needs.  We discussed the benefits of reaching a healthier weight to alleviate the symptoms of existing conditions and reduce the risks of the biomechanical, metabolic and psychological effects of obesity.  Renee Kerr appears to be in the action stage of change and states they are ready to start intensive lifestyle modifications and behavioral modifications.  30 minutes was spent today on this visit including the above counseling, pre-visit chart review, and post-visit documentation.  Reviewed by clinician on day of visit: allergies, medications, problem list, medical history, surgical history, family history, social history, and previous encounter notes pertinent to obesity diagnosis.   Zissel Biederman,PA-C

## 2022-10-21 DIAGNOSIS — R600 Localized edema: Secondary | ICD-10-CM | POA: Diagnosis not present

## 2022-10-21 DIAGNOSIS — L309 Dermatitis, unspecified: Secondary | ICD-10-CM | POA: Diagnosis not present

## 2022-10-21 DIAGNOSIS — Z136 Encounter for screening for cardiovascular disorders: Secondary | ICD-10-CM | POA: Diagnosis not present

## 2022-10-21 DIAGNOSIS — M170 Bilateral primary osteoarthritis of knee: Secondary | ICD-10-CM | POA: Diagnosis not present

## 2022-10-27 NOTE — Telephone Encounter (Signed)
VOB initiated for ZILRETTA for LEFT knee OA

## 2022-10-28 ENCOUNTER — Other Ambulatory Visit: Payer: Self-pay | Admitting: *Deleted

## 2022-10-28 DIAGNOSIS — I872 Venous insufficiency (chronic) (peripheral): Secondary | ICD-10-CM

## 2022-10-28 NOTE — Telephone Encounter (Signed)
NO Prior Auth required for [employer]

## 2022-10-28 NOTE — Telephone Encounter (Signed)
Zilretta for LEFT knee OA  OK to schedule on or after 10/04/22  Primary Insurance: BCBS [place] Medicare Adv HMO Co-pay: $15 Co-insurance: 20% Deductible: does not apply Prior Auth: NOT required   Knee Injection History 08/09/21 - Kenalog LEFT 04/29/22 - Kenalog BILAT 07/10/22 - Zilretta BILAT

## 2022-10-29 DIAGNOSIS — M170 Bilateral primary osteoarthritis of knee: Secondary | ICD-10-CM | POA: Diagnosis not present

## 2022-10-29 DIAGNOSIS — G8929 Other chronic pain: Secondary | ICD-10-CM | POA: Diagnosis not present

## 2022-10-29 DIAGNOSIS — M25562 Pain in left knee: Secondary | ICD-10-CM | POA: Diagnosis not present

## 2022-10-29 DIAGNOSIS — M25561 Pain in right knee: Secondary | ICD-10-CM | POA: Diagnosis not present

## 2022-10-30 NOTE — Telephone Encounter (Signed)
Patient does not wish to schedule

## 2022-10-30 NOTE — Telephone Encounter (Signed)
Left message for patient to call back to schedule.  °

## 2022-11-03 ENCOUNTER — Ambulatory Visit (HOSPITAL_COMMUNITY)
Admission: RE | Admit: 2022-11-03 | Discharge: 2022-11-03 | Disposition: A | Discharge status: Home / Self Care | Payer: Medicare Other | Source: Ambulatory Visit | Attending: Surgery | Admitting: Surgery

## 2022-11-03 DIAGNOSIS — I872 Venous insufficiency (chronic) (peripheral): Secondary | ICD-10-CM | POA: Insufficient documentation

## 2022-11-05 DIAGNOSIS — E65 Localized adiposity: Secondary | ICD-10-CM | POA: Diagnosis not present

## 2022-11-05 DIAGNOSIS — G8929 Other chronic pain: Secondary | ICD-10-CM | POA: Diagnosis not present

## 2022-11-05 DIAGNOSIS — M25561 Pain in right knee: Secondary | ICD-10-CM | POA: Diagnosis not present

## 2022-11-05 DIAGNOSIS — M25562 Pain in left knee: Secondary | ICD-10-CM | POA: Diagnosis not present

## 2022-11-07 ENCOUNTER — Telehealth: Payer: Self-pay | Admitting: Family Medicine

## 2022-11-07 NOTE — Telephone Encounter (Signed)
11/07/22 Renee Kerr from [facility] called saying she sent medical records on behalf of the patient to the provider on 10/31/22 and waiting for the provider to fill out and send back to her. Her call back #[phone]. Fax #[phone]

## 2022-11-18 ENCOUNTER — Ambulatory Visit: Payer: Medicare Other | Admitting: Physician Assistant

## 2022-11-18 VITALS — BP 150/87 | HR 66 | Temp 98.3°F | Resp 20 | Ht 59.0 in | Wt 220.9 lb

## 2022-11-18 DIAGNOSIS — E6609 Other obesity due to excess calories: Secondary | ICD-10-CM | POA: Diagnosis not present

## 2022-11-18 NOTE — Progress Notes (Signed)
[facility] [place]   Reason for referral: knee pain B LE  History of Present Illness  Renee Kerr is a 67 y.o. female who presents with chief complaint: swollen legs above the knees.  Minimal chronic venous insufficiency of bilateral lower extremities causing venous varicosities (C2 disease).  Multiple spider veins B above the knee joints.  She was seen by an orthopedic in the past and told she may need knee replacements for OA.  She is trying to diet and lose weight.  Her knees keep her from walking for exercise.     She denies calcification, rest pain or non healing wounds. She denies daily lower leg to ankle edema.  She wants to make sure she is not harming her legs and that she is safe.       Past Medical History:  Diagnosis Date   Asthma    Chest pain    GERD (gastroesophageal reflux disease)    Heart palpitations    Joint pain, knee    bilateral   Obesity    Pre-diabetes     Past Surgical History:  Procedure Laterality Date   CESAREAN SECTION  02/11/1984   x2 with both children   COLONOSCOPY WITH ESOPHAGOGASTRODUODENOSCOPY (EGD)  2013   DILATATION & CURETTAGE/HYSTEROSCOPY WITH MYOSURE N/A 08/18/2022   Procedure: DILATATION & CURETTAGE/HYSTEROSCOPY WITH POSSIBLE MYOSURE LITE;  Surgeon: Mitchel Honour, DO;  Location: [facility];  Service: Gynecology;  Laterality: N/A;    Social History   Socioeconomic History   Marital status: Married    Spouse name: Justis Dupas   Number of children: 2   Years of education: Not on file   Highest education level: Not on file  Occupational History   Occupation: Presenter, broadcasting: [employer]  Tobacco Use   Smoking status: Former    Current packs/day: 0.00    Average packs/day: 2.0 packs/day for 2.0 years (4.0 ttl pk-yrs)    Types: Cigarettes    Start date: 05/06/1982    Quit date: 05/05/1984    Years since quitting: 38.5   Smokeless tobacco: Never  Vaping Use   Vaping status:  Never Used  Substance and Sexual Activity   Alcohol use: No   Drug use: Never   Sexual activity: Yes  Other Topics Concern   Not on file  Social History Narrative   Lives in [place] with husband. Has two sons one lives in [place] and one lives in [place]. 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   Social Determinants of Health   Financial Resource Strain: Low Risk  (08/06/2021)   Overall Financial Resource Strain (CARDIA)    Difficulty of Paying Living Expenses: Not hard at all  Food Insecurity: No Food Insecurity (08/06/2021)   Hunger Vital Sign    Worried About Running Out of Food in the Last Year: Never true    Ran Out of Food in the Last Year: Never true  Transportation Needs: No Transportation Needs (08/06/2021)   PRAPARE - Administrator, Civil Service (Medical): No    Lack of Transportation (Non-Medical): No  Physical Activity: Inactive (08/06/2021)   Exercise Vital Sign    Days of Exercise per Week: 0 days    Minutes of Exercise per Session: 0 min  Stress: No Stress Concern Present (08/06/2021)   [employer] of Occupational Health - Occupational Stress Questionnaire    Feeling of Stress : Not at all  Social Connections:  Unknown (09/24/2022)   Received from [facility]   Social Network    Social Network: Not on file  Intimate Partner Violence: Unknown (09/24/2022)   Received from [facility]   HITS    Physically Hurt: Not on file    Insult or Talk Down To: Not on file    Threaten Physical Harm: Not on file    Scream or Curse: Not on file    Family History  Problem Relation Age of Onset   Heart attack Mother        mid 30s   Heart attack Father        92, died with first MI   Hypertension Father    Diabetes Brother    Hypertension Brother    Cancer Maternal Grandmother        cervical cancer    Current Outpatient Medications on File Prior to Visit  Medication Sig Dispense Refill   acetaminophen (TYLENOL) 500 MG tablet Take 1  tablet (500 mg total) by mouth every 6 (six) hours as needed. 30 tablet 0   albuterol (VENTOLIN HFA) 108 (90 Base) MCG/ACT inhaler INHALE 2 PUFFS INTO THE LUNGS EVERY 4 HOURS AS NEEDED FOR WHEEZING OR SHORTNESS OF BREATH. 18 each 2   Ascorbic Acid (VITAMIN C PO) Take by mouth.     azelastine (ASTELIN) 0.1 % nasal spray Place 1 spray into both nostrils 2 (two) times daily. Use in each nostril as directed 30 mL 12   benzonatate (TESSALON) 100 MG capsule Take 1 capsule (100 mg total) by mouth 2 (two) times daily as needed for cough. 20 capsule 0   Cyanocobalamin (VITAMIN B-12 PO) Take by mouth.     estradiol (ESTRACE) 0.1 MG/GM vaginal cream Place 1 Applicatorful vaginally 3 (three) times a week.     famotidine (PEPCID) 20 MG tablet Take 20 mg by mouth 2 (two) times daily.     fluticasone (FLONASE) 50 MCG/ACT nasal spray      fluticasone-salmeterol (ADVAIR DISKUS) 250-50 MCG/ACT AEPB Inhale 1 puff into the lungs in the morning and at bedtime. 1 each 5   HYDROcodone bit-homatropine (HYCODAN) 5-1.5 MG/5ML syrup Take 5 mLs by mouth every 8 (eight) hours as needed for cough. 120 mL 0   ibuprofen (ADVIL) 600 MG tablet Take 1 tablet (600 mg total) by mouth every 6 (six) hours as needed. 30 tablet 0   ketoconazole (NIZORAL) 2 % cream Apply 1 Application topically daily. For 14 days. 60 g 0   Multiple Vitamins-Minerals (ZINC PO) Take by mouth.     omeprazole (PRILOSEC) 40 MG capsule Take 40 mg by mouth daily.     oxyCODONE-acetaminophen (PERCOCET) 5-325 MG tablet Take 1 tablet by mouth every 4 (four) hours as needed for severe pain. 10 tablet 0   Vitamin D, Ergocalciferol, (DRISDOL) 1.25 MG (50000 UNIT) CAPS capsule Take 1 capsule (50,000 Units total) by mouth every 7 (seven) days. Take on saturdays 12 capsule 1   No current facility-administered medications on file prior to visit.    Allergies as of 11/18/2022 - Review Complete 11/18/2022  Allergen Reaction Noted   Wound dressing adhesive Hives  09/12/2016   Tramadol Other (See Comments) 08/09/2015     ROS:   General:  No weight loss, Fever, chills  HEENT: No recent headaches, no nasal bleeding, no visual changes, no sore throat  Neurologic: No dizziness, blackouts, seizures. No recent symptoms of stroke or mini- stroke. No recent episodes of slurred speech, or temporary blindness.  Cardiac: No recent episodes of chest pain/pressure, no shortness of breath at rest.  No shortness of breath with exertion.  Denies history of atrial fibrillation or irregular heartbeat  Vascular: No history of rest pain in feet.  No history of claudication.  No history of non-healing ulcer, No history of DVT   Pulmonary: No home oxygen, no productive cough, no hemoptysis,  No asthma or wheezing  Musculoskeletal:  [ ]  Arthritis, [ ]  Low back pain,  [ ]  Joint pain  Hematologic:No history of hypercoagulable state.  No history of easy bleeding.  No history of anemia  Gastrointestinal: No hematochezia or melena,  No gastroesophageal reflux, no trouble swallowing  Urinary: [ ]  chronic Kidney disease, [ ]  on HD - [ ]  MWF or [ ]  TTHS, [ ]  Burning with urination, [ ]  Frequent urination, [ ]  Difficulty urinating;   Skin: No rashes  Psychological: No history of anxiety,  No history of depression  Physical Examination  Vitals:   11/18/22 0955  BP: (!) 150/87  Pulse: 66  Resp: 20  Temp: 98.3 F (36.8 C)  TempSrc: Temporal  SpO2: 94%  Weight: 220 lb 14.4 oz (100.2 kg)  Height: 4\' 11"  (1.499 m)    Body mass index is 44.62 kg/m.  General:  Alert and oriented, no acute distress HEENT: Normal Neck: No bruit or JVD Pulmonary: Clear to auscultation bilaterally Cardiac: Regular Rate and Rhythm without murmur Abdomen: Soft, non-tender, non-distended, no mass, no scars Skin: No rash, spider vein medial above knees Extremity Pulses:  2+ radial,  dorsalis pedis,  pulses bilaterally Musculoskeletal: No deformity or edema  Neurologic: Upper and  lower extremity motor 5/5 and symmetric  DATA: +------------------+---------+------+-----------+------------+-------------  -+  LEFT             Reflux NoRefluxReflux TimeDiameter cmsComments                                     Yes                                          +------------------+---------+------+-----------+------------+-------------  -+  CFV              no                                                     +------------------+---------+------+-----------+------------+-------------  -+  FV mid            no                                                     +------------------+---------+------+-----------+------------+-------------  -+  Popliteal        no                                                     +------------------+---------+------+-----------+------------+-------------  -+  GSV  at [facility]        no                            0.75                     +------------------+---------+------+-----------+------------+-------------  -+  GSV prox thigh    no                            0.37                     +------------------+---------+------+-----------+------------+-------------  -+  GSV mid thigh     no                            0.33                     +------------------+---------+------+-----------+------------+-------------  -+  GSV dist thigh    no                            0.34    out of  fascia   +------------------+---------+------+-----------+------------+-------------  -+  GSV at knee       no                            0.40    out of  fascia   +------------------+---------+------+-----------+------------+-------------  -+  GSV prox calf     no                            0.45                     +------------------+---------+------+-----------+------------+-------------  -+  SSV Pop Fossa     no                            0.25                      +------------------+---------+------+-----------+------------+-------------  -+  anterior accessory          yes    >500 ms      0.69    minimal  reflux  +------------------+---------+------+-----------+------------+-------------  -+        Summary:  Left:  - No evidence of deep vein thrombosis from the common femoral through the  popliteal veins.  - No evidence of superficial venous thrombosis.  - The deep venous system is competent.  - The great and small saphenous veins are competent.  - Mild reflux in the anterior accessory branch.     Assessment/Plan: No deep or GSV reflux noted She has palpable pedal pulses and is not at risk of limb loss.   She states she has knee OA and may need TKA in the future.  She knows she has to loose weight, but her knee pain is preventing her being active with walking.  She is planning on weight loss wellness program and healthy eating.  I suggested she do high rep light weight exercises with her arms and seat/supine mobility exercises with her legs.  I even demonstrated these to her.  I also suggested she get a  wall calendar and keep her self accountable.  She may need to consider weight loss surgery in the future.  Hopefully I motivated her to take care of herself.   F/U PRN   Mosetta Pigeon PA-C [facility] [place] Office: [phone]  MD in clinic [place]

## 2022-11-21 DIAGNOSIS — N950 Postmenopausal bleeding: Secondary | ICD-10-CM | POA: Diagnosis not present

## 2022-12-11 DIAGNOSIS — M25562 Pain in left knee: Secondary | ICD-10-CM | POA: Diagnosis not present

## 2022-12-16 ENCOUNTER — Ambulatory Visit (INDEPENDENT_AMBULATORY_CARE_PROVIDER_SITE_OTHER): Payer: Medicare Other | Admitting: Internal Medicine

## 2022-12-16 ENCOUNTER — Other Ambulatory Visit (INDEPENDENT_AMBULATORY_CARE_PROVIDER_SITE_OTHER): Payer: Self-pay

## 2022-12-16 ENCOUNTER — Encounter (INDEPENDENT_AMBULATORY_CARE_PROVIDER_SITE_OTHER): Payer: Self-pay | Admitting: Internal Medicine

## 2022-12-16 VITALS — BP 146/92 | HR 70 | Temp 98.0°F | Ht <= 58 in | Wt 213.0 lb

## 2022-12-16 DIAGNOSIS — R0602 Shortness of breath: Secondary | ICD-10-CM | POA: Insufficient documentation

## 2022-12-16 DIAGNOSIS — R030 Elevated blood-pressure reading, without diagnosis of hypertension: Secondary | ICD-10-CM | POA: Insufficient documentation

## 2022-12-16 DIAGNOSIS — Z1331 Encounter for screening for depression: Secondary | ICD-10-CM

## 2022-12-16 DIAGNOSIS — R5383 Other fatigue: Secondary | ICD-10-CM | POA: Diagnosis not present

## 2022-12-16 DIAGNOSIS — Z6841 Body Mass Index (BMI) 40.0 and over, adult: Secondary | ICD-10-CM | POA: Diagnosis not present

## 2022-12-16 DIAGNOSIS — R7303 Prediabetes: Secondary | ICD-10-CM

## 2022-12-16 DIAGNOSIS — E66813 Obesity, class 3: Secondary | ICD-10-CM | POA: Diagnosis not present

## 2022-12-16 NOTE — Assessment & Plan Note (Signed)
 See obesity treatment plan

## 2022-12-16 NOTE — Progress Notes (Signed)
Office: [phone]  /  Fax: [phone]    Initial Visit  Renee Kerr (MR# [id number]) is an 67 y.o. female who presents for evaluation and treatment of obesity and related comorbidities. Current BMI is Body mass index is 46.09 kg/m. [name] has been struggling with her weight for many years and has been unsuccessful in either losing weight, maintaining weight loss, or reaching her healthy weight goal.  [name] is currently in the action stage of change and ready to dedicate time achieving and maintaining a healthier weight. [name] is interested in becoming our patient and working on intensive lifestyle modifications including (but not limited to) diet and exercise for weight loss.  When asked what else they would like to accomplish? She states: Adopt healthier eating patterns, Improve energy levels and physical activity, Improve existing medical conditions, Reduce number of medications, Improve quality of life, and Improve appearance  When asked how their weight has affected their life and health, she states: Contributed to medical problems, Contributed to orthopedic problems or mobility issues, Having fatigue, Having poor endurance, and Problems with eating patterns  Weight history:  Discussed the use of AI scribe software for clinical note transcription with the patient, who gave verbal consent to proceed.  She reports a history of weight gain associated with pregnancies and menopause. She describes her current eating pattern as irregular, often skipping breakfast and consuming a small lunch, which she refers to as "brunch". This typically consists of half a small can of chicken salad, eight crackers, a miniature can of soup, and a cup of tea. Dinner is her main meal of the day. She acknowledges challenges with her eating patterns and expresses a desire to improve.  The patient has a history of prediabetes, acid reflux, and vitamin D deficiency, the latter stemming from a  childhood diagnosis of rickets. She has previously tried [employer], with which she had initial success, losing about 20 pounds. However, she struggled to maintain the weight loss, attributing this to poor willpower and social influences.  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 However, she does not engage in regular exercise. She has never taken weight loss medications and expresses a reluctance to do so.  The patient's past medical history also includes orthopedic problems and knee arthritis, which she attributes to her weight. She reports difficulty with mobility due to these issues.   The patient's goal is to improve her overall health and appearance, with a desired weight of 150 pounds. She expresses a willingness to make dietary changes and increase physical activity, but acknowledges challenges with willpower and food preferences. She also expresses interest in having her husband join her in her weight management efforts. Her highest recorded weight was 225 pounds, and she reports that her weight gain began with her pregnancies and was not an issue during her childhood. She denies any significant life events or stressors contributing to her weight gain.       She starting to note weight gain during : pregnancy. Life events associated with weight gain include : pregnancy.  Their highest weight has been 215 lbs. Previous weight-loss programs : Weight Watchers. Their maximum weight loss was 20 lbs. Their greatest challenge with dieting : dieting fatigue. Contributing factors: Family history, Nutritional, Eating patterns, and Hx of pregnancies Weight promoting medications identified: None Current or previous pharmacotherapy: None Response to medication: Never tried medications  Nutritional History:  Current nutrition plan: None How often do they eat breakfast : none,  they skip breakfast. Number of times they eat per day: 2 What beverages do they drink: water, regular or diet soft drinks, na per day, and juice.  Food intolerances or restrictions:  picky eater .Does not like yogurt. Food triggers:  stay awake . Food cravings: Starches and candy  Current level of physical activity: None   Past medical history includes:   Past Medical History:  Diagnosis Date   Asthma    Chest pain    GERD (gastroesophageal reflux disease)    Heart palpitations    Joint pain, knee    bilateral   Lymphedema    Obesity    Osteoarthritis    Pre-diabetes    Prediabetes    Vitamin D deficiency      Objective:   BP (!) 146/92   Pulse 70   Temp 98 F (36.7 C)   Ht 4\' 9"  (1.448 m)   Wt 213 lb (96.6 kg)   SpO2 96%   BMI 46.09 kg/m  She was weighed on the bioimpedance scale: Body mass index is 46.09 kg/m.    EKG: Normal sinus rhythm, rate no conduction abnormalities or signs of chamber enlargement.  Indirect Calorimeter completed today shows a VO2 of 188 and a REE of 1296.  Her calculated basal metabolic rate is 1610 thus her resting energy expenditure slower than calculated.  DIAGNOSTIC DATA REVIEWED:  BMET    Component Value Date/Time   NA 140 06/18/2022 1640   K 3.6 06/18/2022 1640   CL 108 06/18/2022 1640   CO2 22 06/18/2022 1640   GLUCOSE 96 06/18/2022 1640   BUN 13 06/18/2022 1640   CREATININE 0.86 06/18/2022 1640   CALCIUM 8.5 (L) 06/18/2022 1640   GFRNONAA >60 06/18/2022 1640   GFRAA 86 (L) 06/13/2013 1735   Lab Results  Component Value Date   HGBA1C 6.2 06/23/2022   HGBA1C 6.1 (H) 12/24/2012   No results found for: "INSULIN" CBC    Component Value Date/Time   WBC 9.1 08/18/2022 0712   RBC 4.65 08/18/2022 0712   HGB 11.9 (L) 08/18/2022 0712   HCT 38.7 08/18/2022 0712   PLT 246 08/18/2022 0712   MCV 83.2 08/18/2022 0712   MCH 25.6 (L) 08/18/2022 0712   MCHC 30.7 08/18/2022 0712   RDW 14.6 08/18/2022 0712    Iron/TIBC/Ferritin/ %Sat    Component Value Date/Time   IRON 72 06/23/2022 1108   TIBC 290 06/23/2022 1108   FERRITIN 144 06/23/2022 1108   IRONPCTSAT 25 06/23/2022 1108   Lipid Panel     Component Value Date/Time   CHOL 123 06/23/2022 1108   TRIG 64.0 06/23/2022 1108   HDL 57.60 06/23/2022 1108   CHOLHDL 2 06/23/2022 1108   VLDL 12.8 06/23/2022 1108   LDLCALC 53 06/23/2022 1108   Hepatic Function Panel     Component Value Date/Time   PROT 7.4 06/18/2022 1640   ALBUMIN 3.5 06/18/2022 1640   AST 19 06/18/2022 1640   ALT 14 06/18/2022 1640   ALKPHOS 58 06/18/2022 1640   BILITOT 0.3 06/18/2022 1640   BILIDIR 0.1 06/02/2008 2333   IBILI 0.1 06/02/2008 2333      Component Value Date/Time   TSH 1.37 06/23/2022 1108     Assessment and Plan:    TREATMENT PLAN FOR OBESITY:  Recommended Dietary Goals  Katrenia is currently in the action stage of change. As such, her goal is to implement medically  supervised weight loss plan.  She has agreed to start: follow the Category 2 plan  Behavioral Intervention  We discussed the following Behavioral Modification Strategies today: increasing lean protein intake to established goals, decreasing simple carbohydrates , increasing vegetables, increasing lower glycemic fruits, increasing fiber rich foods, avoiding skipping meals, increasing water intake , work on meal planning and preparation, reading food labels , keeping healthy foods at home, identifying sources and decreasing liquid calories, planning for success, and better snacking choices.  Additional resources provided today: None  Recommended Physical Activity Goals  Kanda has been advised to work up to 150 minutes of moderate intensity aerobic activity a week and strengthening exercises 2-3 times per week for cardiovascular health, weight loss maintenance and preservation of muscle mass.   She has agreed to :  Think about enjoyable ways to increase daily physical  activity and overcoming barriers to exercise and Increase physical activity in their day and reduce sedentary time (increase NEAT).  Pharmacotherapy We will work on building a Therapist, art and behavioral strategies. We will discuss the role of pharmacotherapy as an adjunct at subsequent visits.   ASSOCIATED CONDITIONS ADDRESSED TODAY  Other fatigue Assessment & Plan: Multifactorial.  Her IC suggest a slower than predicted metabolism.  She had an Epworth of 1.  We will check serologies for metabolic derangements.  Losing weight and improving nutrition may help with fatigue.  Orders: -     EKG 12-Lead -     CBC with Differential/Platelet -     Comprehensive metabolic panel  SOB (shortness of breath) on exertion  Depression screen  Class 3 severe obesity due to excess calories with serious comorbidity and body mass index (BMI) of 45.0 to 49.9 in adult [facility]) Assessment & Plan: See obesity treatment plan   Prediabetes -     Hemoglobin A1c -     Insulin, random -     Lipid Panel With LDL/HDL Ratio  Elevated blood pressure reading without diagnosis of hypertension Assessment & Plan: Patient reports not having problems with high blood pressure in the past.  I have reviewed vital signs flowsheet and she has had several elevations some of them as high as 160 systolic.  She does not monitor at home and is under the impression that she has good control of her blood pressure.  I recommend that she begin monitoring at home.  I also counseled her on the risk associated with untreated hypertension.  She will look at trends and follow-up with primary care team if she needs to start blood pressure medications.  We will follow-up at the next office visit.  We will check renal parameters during this appointment.     PHYSICAL EXAM:  Blood pressure (!) 146/92, pulse 70, temperature 98 F (36.7 C), height 4\' 9"  (1.448 m), weight 213 lb (96.6 kg), SpO2 96%. Body mass index is  46.09 kg/m.  General: She is overweight, cooperative, alert, well developed, and in no acute distress. PSYCH: Has normal mood, affect and thought process.   HEENT: EOMI, sclerae are anicteric. Lungs: Normal breathing effort, no conversational dyspnea. Extremities: No edema.  Neurologic: No gross sensory or motor deficits. No tremors or fasciculations noted.    DIAGNOSTIC DATA REVIEWED:  BMET    Component Value Date/Time   NA 140 06/18/2022 1640   K 3.6 06/18/2022 1640   CL 108 06/18/2022 1640   CO2 22 06/18/2022 1640   GLUCOSE 96 06/18/2022 1640   BUN 13 06/18/2022 1640   CREATININE  0.86 06/18/2022 1640   CALCIUM 8.5 (L) 06/18/2022 1640   GFRNONAA >60 06/18/2022 1640   GFRAA 86 (L) 06/13/2013 1735   Lab Results  Component Value Date   HGBA1C 6.2 06/23/2022   HGBA1C 6.1 (H) 12/24/2012   No results found for: "INSULIN" Lab Results  Component Value Date   TSH 1.37 06/23/2022   CBC    Component Value Date/Time   WBC 9.1 08/18/2022 0712   RBC 4.65 08/18/2022 0712   HGB 11.9 (L) 08/18/2022 0712   HCT 38.7 08/18/2022 0712   PLT 246 08/18/2022 0712   MCV 83.2 08/18/2022 0712   MCH 25.6 (L) 08/18/2022 0712   MCHC 30.7 08/18/2022 0712   RDW 14.6 08/18/2022 0712   Iron Studies    Component Value Date/Time   IRON 72 06/23/2022 1108   TIBC 290 06/23/2022 1108   FERRITIN 144 06/23/2022 1108   IRONPCTSAT 25 06/23/2022 1108   Lipid Panel     Component Value Date/Time   CHOL 123 06/23/2022 1108   TRIG 64.0 06/23/2022 1108   HDL 57.60 06/23/2022 1108   CHOLHDL 2 06/23/2022 1108   VLDL 12.8 06/23/2022 1108   LDLCALC 53 06/23/2022 1108   Hepatic Function Panel     Component Value Date/Time   PROT 7.4 06/18/2022 1640   ALBUMIN 3.5 06/18/2022 1640   AST 19 06/18/2022 1640   ALT 14 06/18/2022 1640   ALKPHOS 58 06/18/2022 1640   BILITOT 0.3 06/18/2022 1640   BILIDIR 0.1 06/02/2008 2333   IBILI 0.1 06/02/2008 2333      Component Value Date/Time   TSH 1.37  06/23/2022 1108   Nutritional Lab Results  Component Value Date   VD25OH 50.82 06/23/2022   VD25OH 57.81 05/30/2021     She was informed of the importance of frequent follow-up visits to maximize her success with intensive lifestyle modifications for her multiple health conditions. She was informed we would discuss her lab results at her next visit unless there is a critical issue that needs to be addressed sooner. Chantell agreed to keep her next visit at the agreed upon time to discuss these results.  ATTESTASTION STATEMENTS:  Reviewed by clinician on day of visit: allergies, medications, problem list, medical history, surgical history, family history, social history, and previous encounter notes.   I have spent 40 minutes in the care of the patient today including: preparing to see patient (e.g. review and interpretation of tests, old notes ), obtaining and/or reviewing separately obtained history, performing a medically appropriate examination or evaluation, counseling and educating the patient, ordering medications, test or procedures, documenting clinical information in the electronic or other health care record, and independently interpreting results and communicating results to the patient, family, or caregiver   Worthy Rancher, MD

## 2022-12-16 NOTE — Assessment & Plan Note (Signed)
Multifactorial.  Her IC suggest a slower than predicted metabolism.  She had an Epworth of 1.  We will check serologies for metabolic derangements.  Losing weight and improving nutrition may help with fatigue.

## 2022-12-16 NOTE — Assessment & Plan Note (Signed)
Patient reports not having problems with high blood pressure in the past.  I have reviewed vital signs flowsheet and she has had several elevations some of them as high as 160 systolic.  She does not monitor at home and is under the impression that she has good control of her blood pressure.  I recommend that she begin monitoring at home.  I also counseled her on the risk associated with untreated hypertension.  She will look at trends and follow-up with primary care team if she needs to start blood pressure medications.  We will follow-up at the next office visit.  We will check renal parameters during this appointment.

## 2022-12-17 LAB — CBC WITH DIFFERENTIAL/PLATELET
Basophils Absolute: 0 10*3/uL (ref 0.0–0.2)
Basos: 0 %
EOS (ABSOLUTE): 0.1 10*3/uL (ref 0.0–0.4)
Eos: 2 %
Hematocrit: 40.8 % (ref 34.0–46.6)
Hemoglobin: 12.7 g/dL (ref 11.1–15.9)
Immature Grans (Abs): 0 10*3/uL (ref 0.0–0.1)
Immature Granulocytes: 0 %
Lymphocytes Absolute: 2.2 10*3/uL (ref 0.7–3.1)
Lymphs: 32 %
MCH: 25.2 pg — ABNORMAL LOW (ref 26.6–33.0)
MCHC: 31.1 g/dL — ABNORMAL LOW (ref 31.5–35.7)
MCV: 81 fL (ref 79–97)
Monocytes Absolute: 0.5 10*3/uL (ref 0.1–0.9)
Monocytes: 7 %
Neutrophils Absolute: 4 10*3/uL (ref 1.4–7.0)
Neutrophils: 59 %
Platelets: 288 10*3/uL (ref 150–450)
RBC: 5.04 x10E6/uL (ref 3.77–5.28)
RDW: 13.7 % (ref 11.7–15.4)
WBC: 6.9 10*3/uL (ref 3.4–10.8)

## 2022-12-17 LAB — COMPREHENSIVE METABOLIC PANEL
ALT: 10 [IU]/L (ref 0–32)
AST: 13 [IU]/L (ref 0–40)
Albumin: 4.1 g/dL (ref 3.9–4.9)
Alkaline Phosphatase: 75 [IU]/L (ref 44–121)
BUN/Creatinine Ratio: 15 (ref 12–28)
BUN: 12 mg/dL (ref 8–27)
Bilirubin Total: 0.3 mg/dL (ref 0.0–1.2)
CO2: 26 mmol/L (ref 20–29)
Calcium: 8.8 mg/dL (ref 8.7–10.3)
Chloride: 106 mmol/L (ref 96–106)
Creatinine, Ser: 0.79 mg/dL (ref 0.57–1.00)
Globulin, Total: 2.9 g/dL (ref 1.5–4.5)
Glucose: 88 mg/dL (ref 70–99)
Potassium: 4.1 mmol/L (ref 3.5–5.2)
Sodium: 144 mmol/L (ref 134–144)
Total Protein: 7 g/dL (ref 6.0–8.5)
eGFR: 81 mL/min/{1.73_m2} (ref >59)

## 2022-12-17 LAB — LIPID PANEL WITH LDL/HDL RATIO
Cholesterol, Total: 116 mg/dL (ref 100–199)
HDL: 50 mg/dL (ref >39)
LDL Chol Calc (NIH): 50 mg/dL (ref 0–99)
LDL/HDL Ratio: 1 ratio (ref 0.0–3.2)
Triglycerides: 78 mg/dL (ref 0–149)
VLDL Cholesterol Cal: 16 mg/dL (ref 5–40)

## 2022-12-17 LAB — HEMOGLOBIN A1C
Est. average glucose Bld gHb Est-mCnc: 134 mg/dL
Hgb A1c MFr Bld: 6.3 % — ABNORMAL HIGH (ref 4.8–5.6)

## 2022-12-17 LAB — INSULIN, RANDOM: INSULIN: 17.7 u[IU]/mL (ref 2.6–24.9)

## 2022-12-30 ENCOUNTER — Ambulatory Visit (INDEPENDENT_AMBULATORY_CARE_PROVIDER_SITE_OTHER): Payer: Self-pay | Admitting: Internal Medicine

## 2023-01-13 ENCOUNTER — Ambulatory Visit (INDEPENDENT_AMBULATORY_CARE_PROVIDER_SITE_OTHER): Payer: Self-pay | Admitting: Internal Medicine

## 2023-01-16 DIAGNOSIS — M1712 Unilateral primary osteoarthritis, left knee: Secondary | ICD-10-CM | POA: Diagnosis not present

## 2023-01-16 DIAGNOSIS — M1711 Unilateral primary osteoarthritis, right knee: Secondary | ICD-10-CM | POA: Diagnosis not present

## 2023-01-20 DIAGNOSIS — J209 Acute bronchitis, unspecified: Secondary | ICD-10-CM | POA: Diagnosis not present

## 2023-01-20 DIAGNOSIS — M170 Bilateral primary osteoarthritis of knee: Secondary | ICD-10-CM | POA: Diagnosis not present

## 2023-01-23 DIAGNOSIS — M1711 Unilateral primary osteoarthritis, right knee: Secondary | ICD-10-CM | POA: Diagnosis not present

## 2023-01-30 DIAGNOSIS — M1711 Unilateral primary osteoarthritis, right knee: Secondary | ICD-10-CM | POA: Diagnosis not present

## 2023-01-30 DIAGNOSIS — M1712 Unilateral primary osteoarthritis, left knee: Secondary | ICD-10-CM | POA: Diagnosis not present

## 2023-02-06 DIAGNOSIS — M1711 Unilateral primary osteoarthritis, right knee: Secondary | ICD-10-CM | POA: Diagnosis not present

## 2023-02-06 DIAGNOSIS — M1712 Unilateral primary osteoarthritis, left knee: Secondary | ICD-10-CM | POA: Diagnosis not present

## 2023-02-09 ENCOUNTER — Ambulatory Visit (INDEPENDENT_AMBULATORY_CARE_PROVIDER_SITE_OTHER): Payer: Medicare Other | Admitting: Internal Medicine

## 2023-02-13 DIAGNOSIS — M1711 Unilateral primary osteoarthritis, right knee: Secondary | ICD-10-CM | POA: Diagnosis not present

## 2023-02-13 DIAGNOSIS — M1712 Unilateral primary osteoarthritis, left knee: Secondary | ICD-10-CM | POA: Diagnosis not present

## 2023-02-17 DIAGNOSIS — J069 Acute upper respiratory infection, unspecified: Secondary | ICD-10-CM | POA: Diagnosis not present

## 2023-02-19 ENCOUNTER — Ambulatory Visit (INDEPENDENT_AMBULATORY_CARE_PROVIDER_SITE_OTHER): Payer: Self-pay | Admitting: Internal Medicine

## 2023-02-23 ENCOUNTER — Ambulatory Visit (INDEPENDENT_AMBULATORY_CARE_PROVIDER_SITE_OTHER): Payer: Medicare Other | Admitting: Internal Medicine

## 2023-03-06 DIAGNOSIS — M1711 Unilateral primary osteoarthritis, right knee: Secondary | ICD-10-CM | POA: Diagnosis not present

## 2023-03-06 DIAGNOSIS — M1712 Unilateral primary osteoarthritis, left knee: Secondary | ICD-10-CM | POA: Diagnosis not present

## 2023-03-13 DIAGNOSIS — M1711 Unilateral primary osteoarthritis, right knee: Secondary | ICD-10-CM | POA: Diagnosis not present

## 2023-03-13 DIAGNOSIS — M1712 Unilateral primary osteoarthritis, left knee: Secondary | ICD-10-CM | POA: Diagnosis not present

## 2023-03-17 DIAGNOSIS — M1712 Unilateral primary osteoarthritis, left knee: Secondary | ICD-10-CM | POA: Diagnosis not present

## 2023-03-17 DIAGNOSIS — M1711 Unilateral primary osteoarthritis, right knee: Secondary | ICD-10-CM | POA: Diagnosis not present

## 2023-03-25 DIAGNOSIS — Z03818 Encounter for observation for suspected exposure to other biological agents ruled out: Secondary | ICD-10-CM | POA: Diagnosis not present

## 2023-03-25 DIAGNOSIS — J069 Acute upper respiratory infection, unspecified: Secondary | ICD-10-CM | POA: Diagnosis not present

## 2023-03-25 DIAGNOSIS — R051 Acute cough: Secondary | ICD-10-CM | POA: Diagnosis not present

## 2023-04-04 DIAGNOSIS — J209 Acute bronchitis, unspecified: Secondary | ICD-10-CM | POA: Diagnosis not present

## 2023-04-10 DIAGNOSIS — M1711 Unilateral primary osteoarthritis, right knee: Secondary | ICD-10-CM | POA: Diagnosis not present

## 2023-04-10 DIAGNOSIS — M1712 Unilateral primary osteoarthritis, left knee: Secondary | ICD-10-CM | POA: Diagnosis not present

## 2023-04-17 DIAGNOSIS — M1712 Unilateral primary osteoarthritis, left knee: Secondary | ICD-10-CM | POA: Diagnosis not present

## 2023-04-17 DIAGNOSIS — M1711 Unilateral primary osteoarthritis, right knee: Secondary | ICD-10-CM | POA: Diagnosis not present

## 2023-04-21 ENCOUNTER — Encounter (INDEPENDENT_AMBULATORY_CARE_PROVIDER_SITE_OTHER): Payer: Self-pay

## 2023-05-29 DIAGNOSIS — M1711 Unilateral primary osteoarthritis, right knee: Secondary | ICD-10-CM | POA: Diagnosis not present

## 2023-05-29 DIAGNOSIS — M1712 Unilateral primary osteoarthritis, left knee: Secondary | ICD-10-CM | POA: Diagnosis not present

## 2023-06-25 DIAGNOSIS — Z1331 Encounter for screening for depression: Secondary | ICD-10-CM | POA: Diagnosis not present

## 2023-06-25 DIAGNOSIS — Z0000 Encounter for general adult medical examination without abnormal findings: Secondary | ICD-10-CM | POA: Diagnosis not present

## 2023-07-24 DIAGNOSIS — J301 Allergic rhinitis due to pollen: Secondary | ICD-10-CM | POA: Diagnosis not present

## 2023-07-24 DIAGNOSIS — R0602 Shortness of breath: Secondary | ICD-10-CM | POA: Diagnosis not present

## 2023-07-24 DIAGNOSIS — J4520 Mild intermittent asthma, uncomplicated: Secondary | ICD-10-CM | POA: Diagnosis not present

## 2023-09-23 DIAGNOSIS — H40033 Anatomical narrow angle, bilateral: Secondary | ICD-10-CM | POA: Diagnosis not present

## 2023-09-23 DIAGNOSIS — H2513 Age-related nuclear cataract, bilateral: Secondary | ICD-10-CM | POA: Diagnosis not present

## 2023-10-27 IMAGING — MG MM DIGITAL SCREENING BILAT W/ TOMO AND CAD
8 series · 8 of 24 positions shown · non-contrast
Comparison: Previous exam(s).

CLINICAL DATA: Screening.

EXAM:
DIGITAL SCREENING BILATERAL MAMMOGRAM WITH TOMOSYNTHESIS AND CAD
TECHNIQUE: Bilateral screening digital craniocaudal and mediolateral oblique
mammograms were obtained. Bilateral screening digital breast
tomosynthesis was performed. The images were evaluated with
computer-aided detection.

[R CC synth-2D]
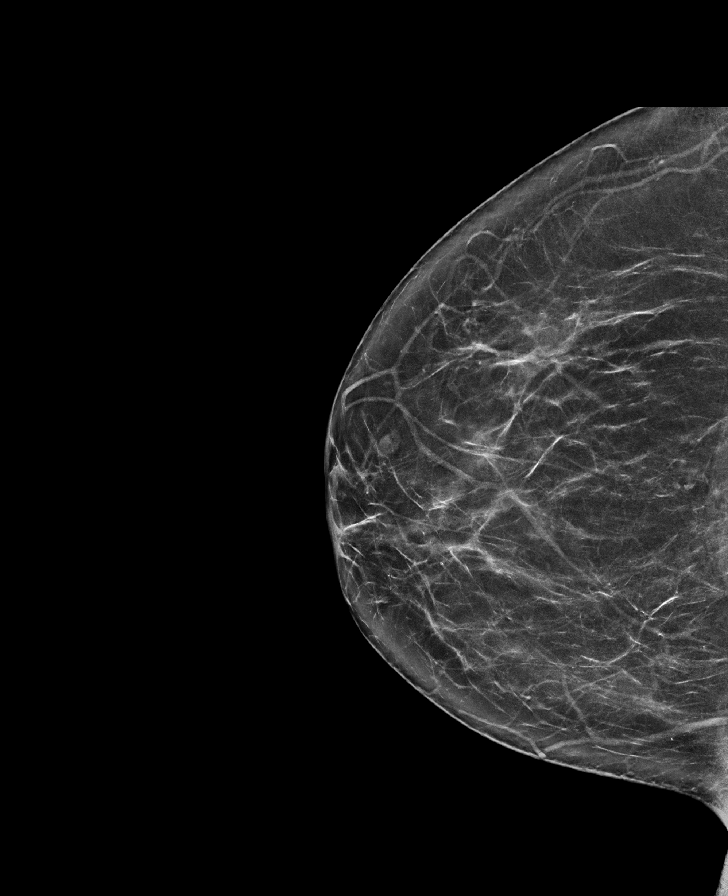

[L MLO synth-2D]
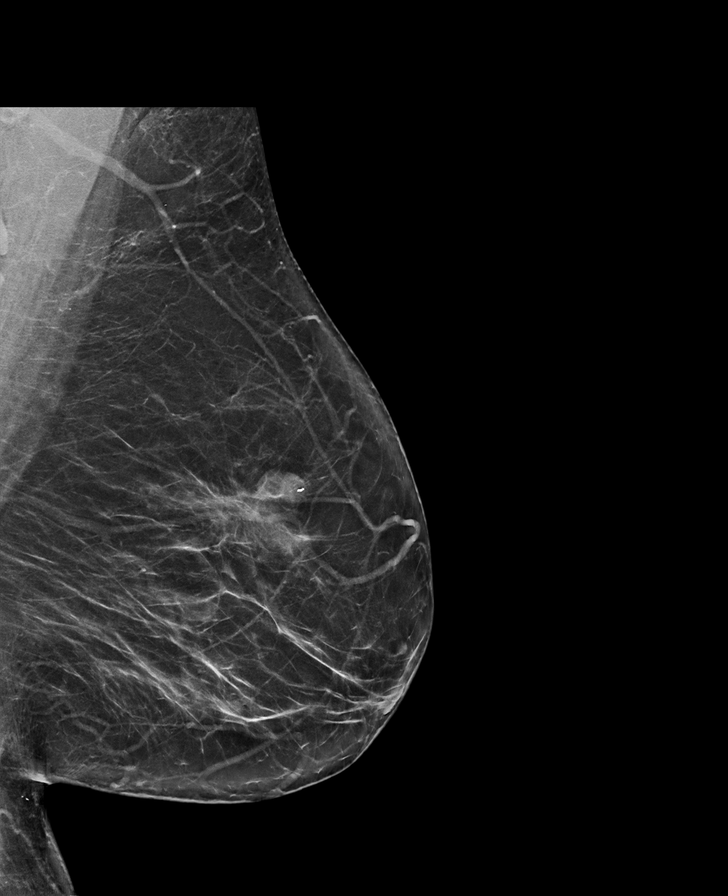

[R MLO synth-2D]
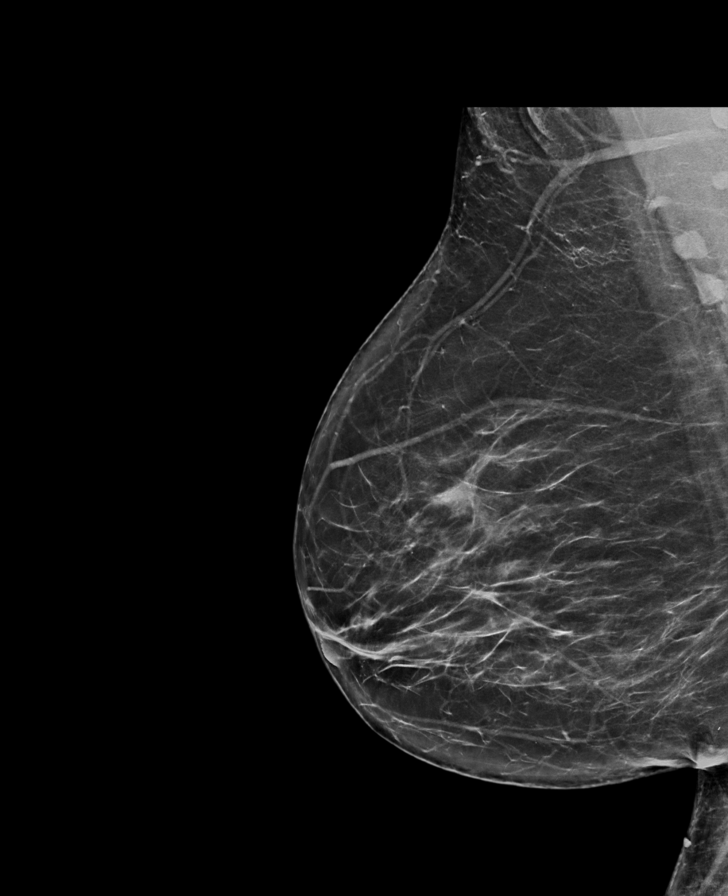

[L CC synth-2D]
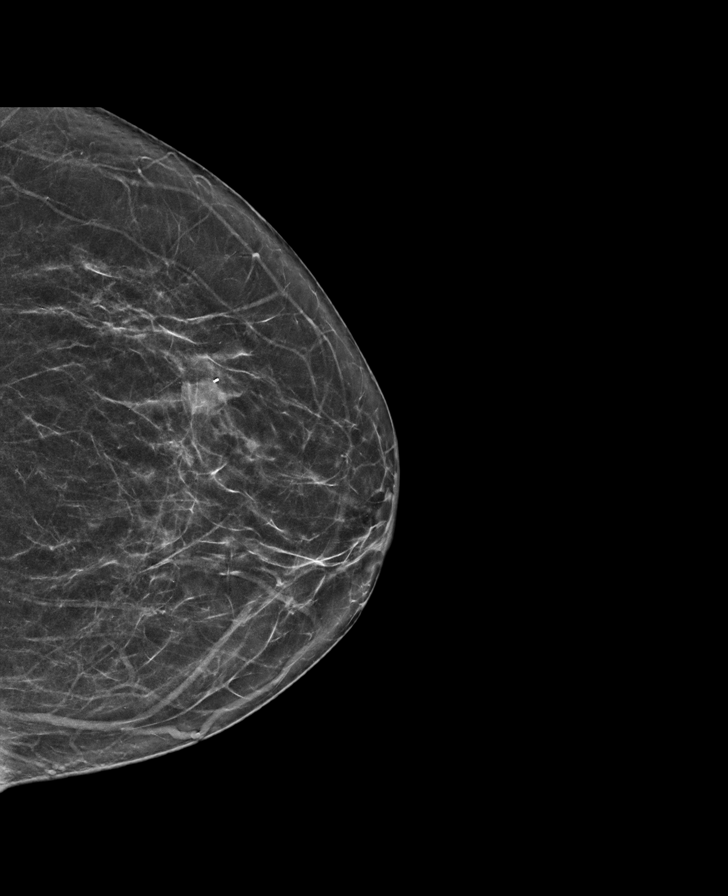

[L CC tomo · tomo slice 33/65.0]
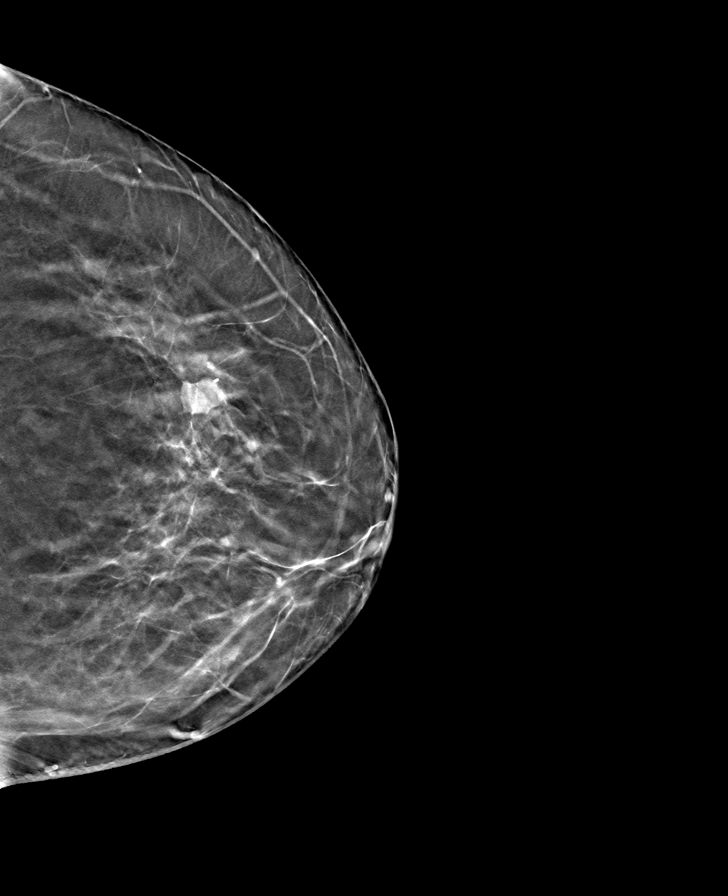

[R MLO tomo · tomo slice 41/80.0]
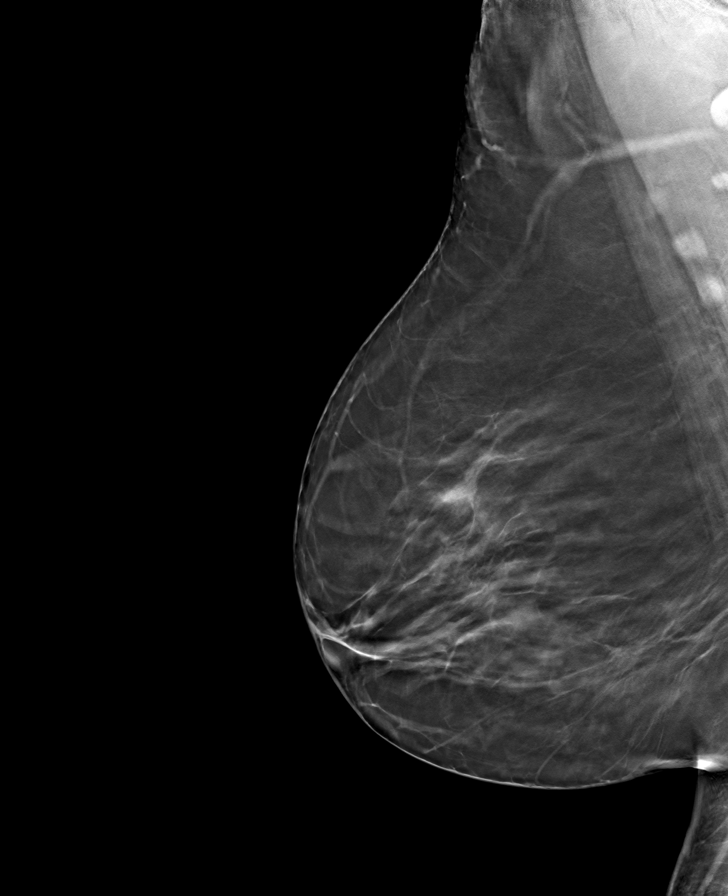

[L MLO tomo · tomo slice 39/78.0]
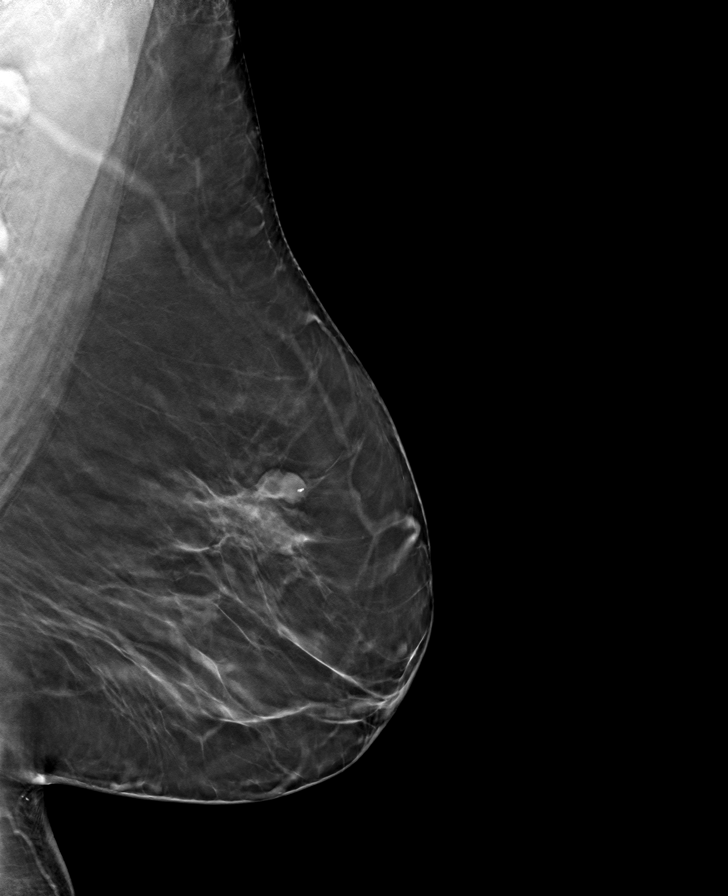

[R CC tomo · tomo slice 36/71.0]
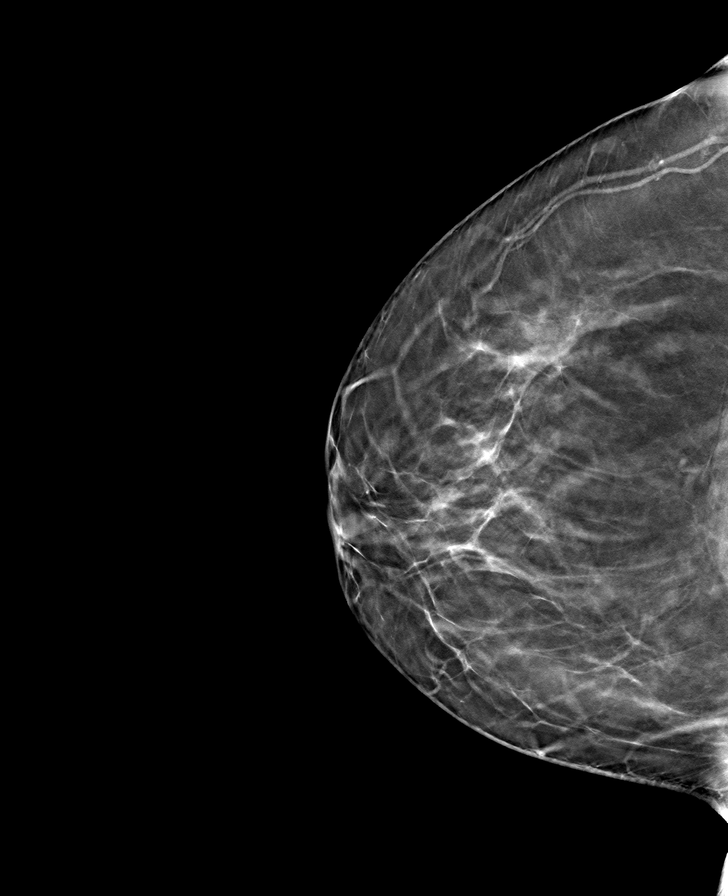

[8 of 24 positions shown; findings below may reference images not displayed]

ACR Breast Density Category b: There are scattered areas of
fibroglandular density.
FINDINGS: In the right breast, a possible asymmetry warrants further
evaluation. In the left breast, no findings suspicious for
malignancy.
IMPRESSION: Further evaluation is suggested for possible asymmetry in the right
breast.

RECOMMENDATION:
Diagnostic mammogram and possibly ultrasound of the right breast.
(Code:BU-[id number])

The patient will be contacted regarding the findings, and additional
imaging will be scheduled.

BI-RADS CATEGORY  0: Incomplete. Need additional imaging evaluation
and/or prior mammograms for comparison.

## 2023-10-30 DIAGNOSIS — J310 Chronic rhinitis: Secondary | ICD-10-CM | POA: Diagnosis not present

## 2023-10-30 DIAGNOSIS — N950 Postmenopausal bleeding: Secondary | ICD-10-CM | POA: Diagnosis not present

## 2024-01-20 DIAGNOSIS — R051 Acute cough: Secondary | ICD-10-CM | POA: Diagnosis not present

## 2024-01-20 DIAGNOSIS — R062 Wheezing: Secondary | ICD-10-CM | POA: Diagnosis not present

## 2024-01-20 DIAGNOSIS — R0981 Nasal congestion: Secondary | ICD-10-CM | POA: Diagnosis not present

## 2024-01-20 DIAGNOSIS — R0982 Postnasal drip: Secondary | ICD-10-CM | POA: Diagnosis not present
